# Patient Record
Sex: Female | Born: 1965 | Race: White | Hispanic: No | Marital: Single | Smoking: Never smoker
Health system: Southern US, Community
[De-identification: ages and names within clinical notes are randomized; demographics above are authoritative.]

## PROBLEM LIST (undated history)

## (undated) DIAGNOSIS — F32A Depression, unspecified: Secondary | ICD-10-CM

## (undated) DIAGNOSIS — N289 Disorder of kidney and ureter, unspecified: Secondary | ICD-10-CM

## (undated) DIAGNOSIS — G43909 Migraine, unspecified, not intractable, without status migrainosus: Secondary | ICD-10-CM

## (undated) DIAGNOSIS — F329 Major depressive disorder, single episode, unspecified: Secondary | ICD-10-CM

## (undated) HISTORY — PX: APPENDECTOMY: SHX54

## (undated) HISTORY — PX: INSERT INTRAPERITONEAL CATHETER: SUR716

## (undated) HISTORY — PX: LUMBAR LAMINECTOMY: SHX95

## (undated) HISTORY — PX: ABDOMINAL HYSTERECTOMY: SHX81

## (undated) HISTORY — PX: CHOLECYSTECTOMY: SHX55

---

## 2002-12-25 ENCOUNTER — Encounter: Payer: Self-pay | Admitting: Emergency Medicine

## 2002-12-25 ENCOUNTER — Emergency Department (HOSPITAL_COMMUNITY): Admission: EM | Admit: 2002-12-25 | Discharge: 2002-12-25 | Payer: Self-pay | Admitting: Emergency Medicine

## 2003-01-14 ENCOUNTER — Ambulatory Visit: Admission: RE | Admit: 2003-01-14 | Discharge: 2003-01-14 | Payer: Self-pay | Admitting: Cardiology

## 2003-01-14 ENCOUNTER — Encounter (INDEPENDENT_AMBULATORY_CARE_PROVIDER_SITE_OTHER): Payer: Self-pay | Admitting: Cardiology

## 2003-06-28 ENCOUNTER — Emergency Department (HOSPITAL_COMMUNITY): Admission: EM | Admit: 2003-06-28 | Discharge: 2003-06-28 | Payer: Self-pay | Admitting: Emergency Medicine

## 2003-06-28 ENCOUNTER — Encounter: Payer: Self-pay | Admitting: Emergency Medicine

## 2003-07-01 ENCOUNTER — Encounter: Payer: Self-pay | Admitting: Internal Medicine

## 2003-07-01 ENCOUNTER — Observation Stay (HOSPITAL_COMMUNITY): Admission: AD | Admit: 2003-07-01 | Discharge: 2003-07-02 | Payer: Self-pay | Admitting: Internal Medicine

## 2003-07-02 ENCOUNTER — Encounter (INDEPENDENT_AMBULATORY_CARE_PROVIDER_SITE_OTHER): Payer: Self-pay | Admitting: Specialist

## 2003-08-05 ENCOUNTER — Ambulatory Visit (HOSPITAL_COMMUNITY): Admission: RE | Admit: 2003-08-05 | Discharge: 2003-08-05 | Payer: Self-pay | Admitting: Internal Medicine

## 2006-05-27 ENCOUNTER — Emergency Department (HOSPITAL_COMMUNITY): Admission: EM | Admit: 2006-05-27 | Discharge: 2006-05-27 | Payer: Self-pay | Admitting: Emergency Medicine

## 2007-02-21 IMAGING — CT CT HEAD W/O CM
1 series · 16 of 30 positions shown, 20 images · non-contrast
Comparison: none

CLINICAL DATA: Hit forehead on TV.  Dizziness and headache.
 HEAD CT WITHOUT CONTRAST ? 05/27/06:
TECHNIQUE: Contiguous axial CT images were obtained from the base of the skull through the vertex according to standard protocol without contrast.

[Series 2: head-trauma 4.8 h45s st · axial · 0.45mm/px · z∈[-102,+31]mm · 16 of 30 slices shown, 20 images]
[im 2/30  brain]
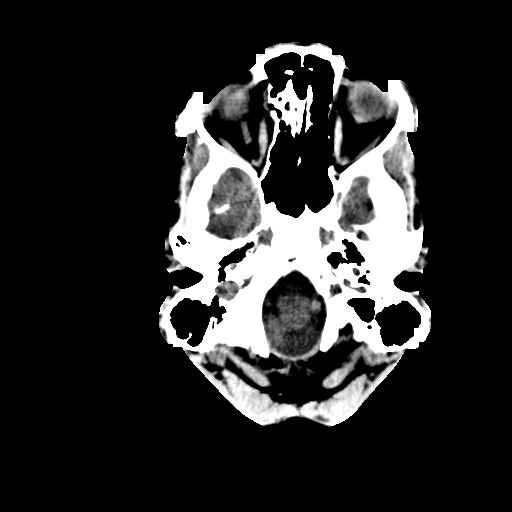
[im 2/30  bone]
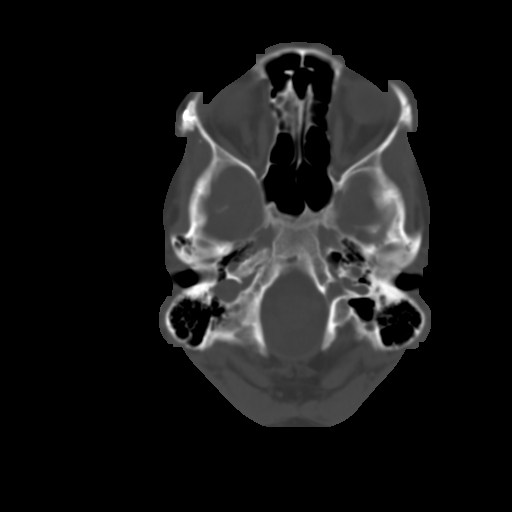
[im 4/30  brain]
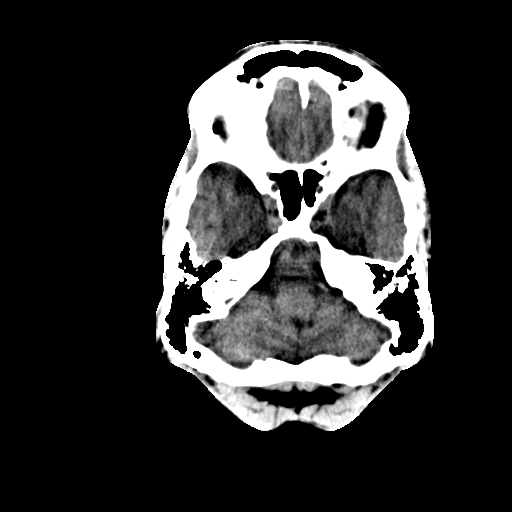
[im 6/30  brain]
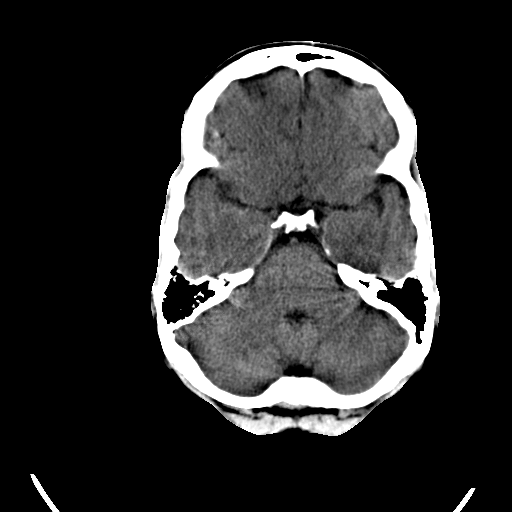
[im 8/30  brain]
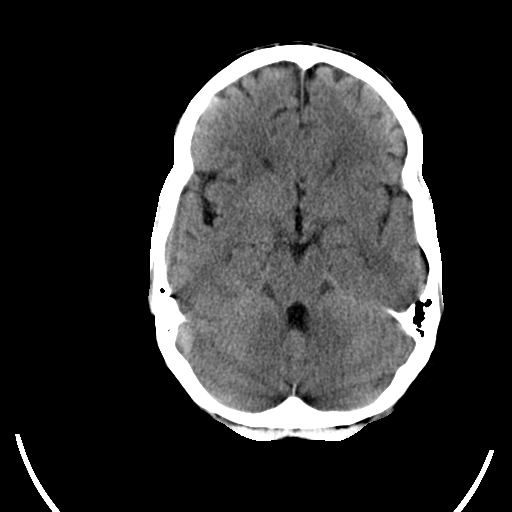
[im 9/30  brain]
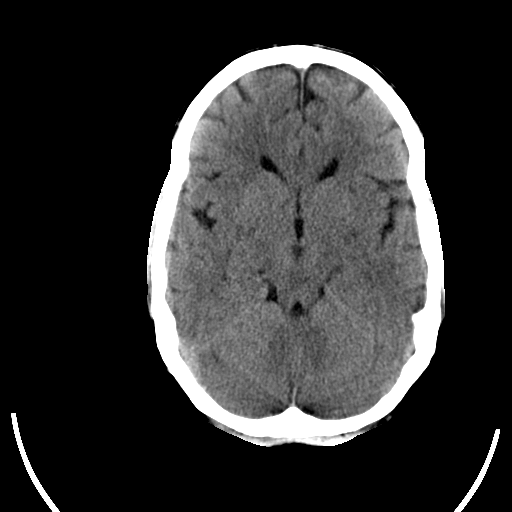
[im 9/30  bone]
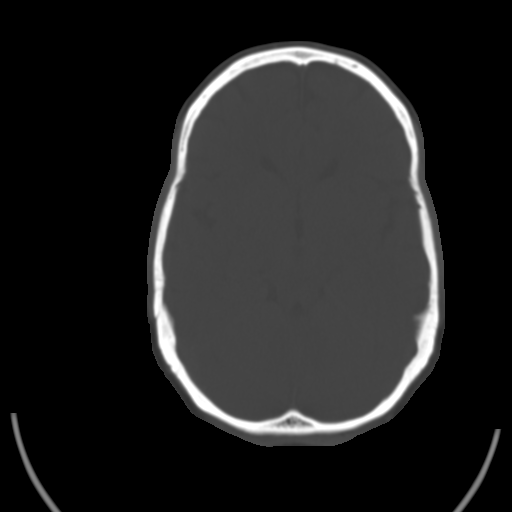
[im 11/30  brain]
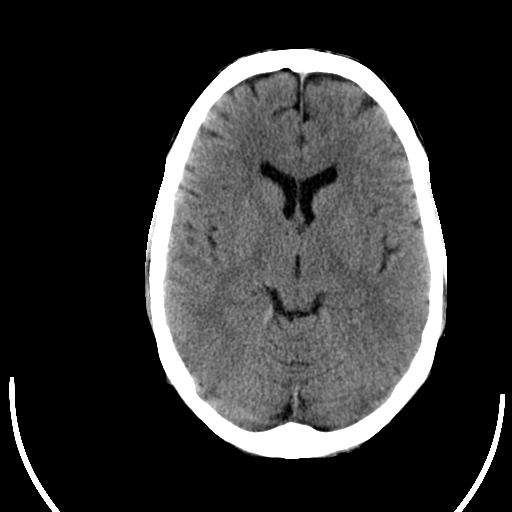
[im 13/30  brain]
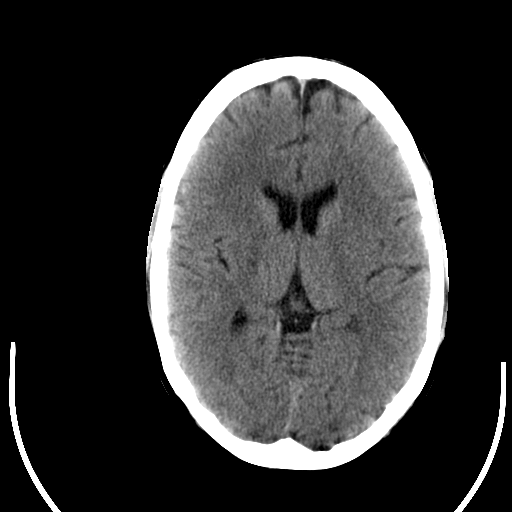
[im 15/30  brain]
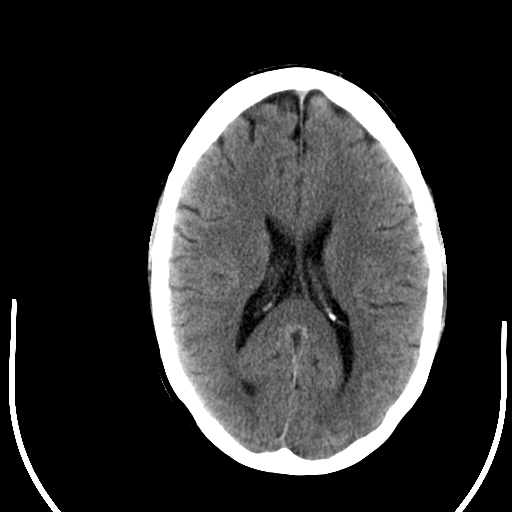
[im 16/30  brain]
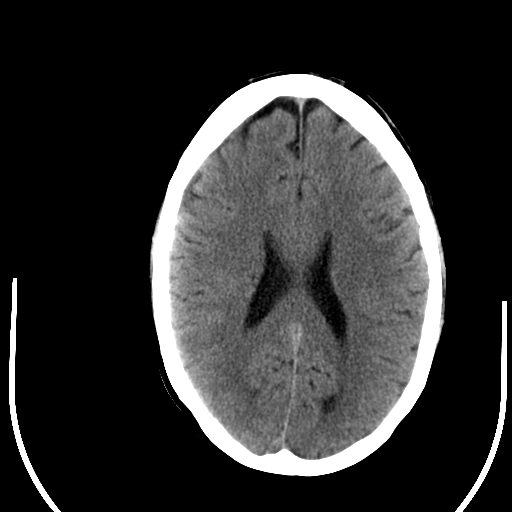
[im 16/30  bone]
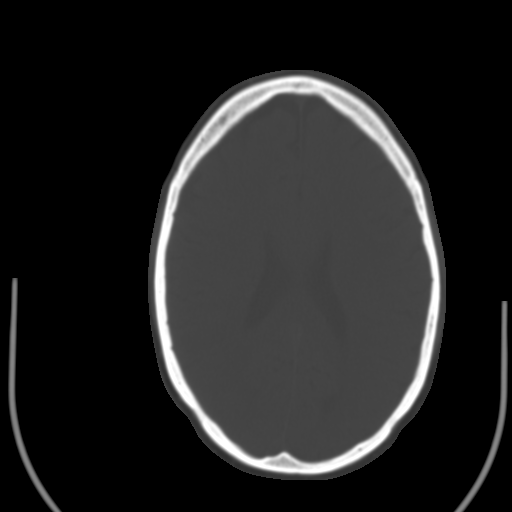
[im 18/30  brain]
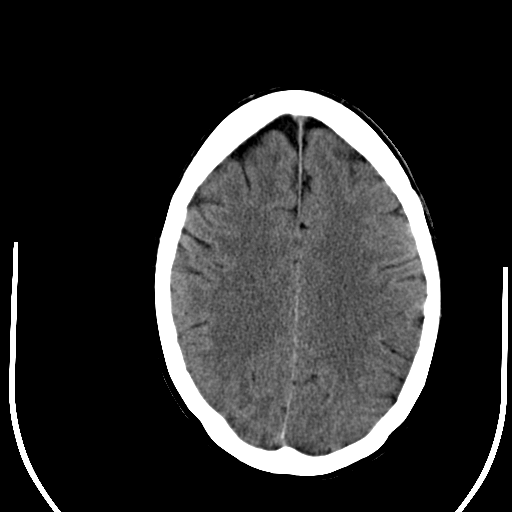
[im 20/30  brain]
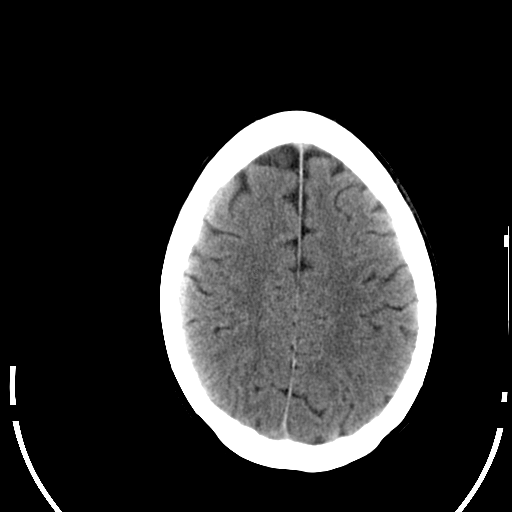
[im 22/30  brain]
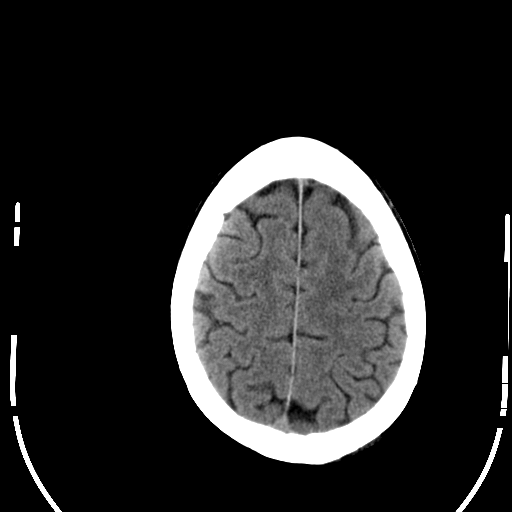
[im 23/30  brain]
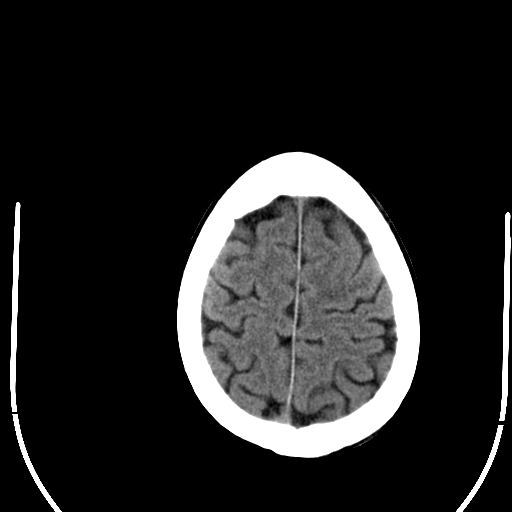
[im 23/30  bone]
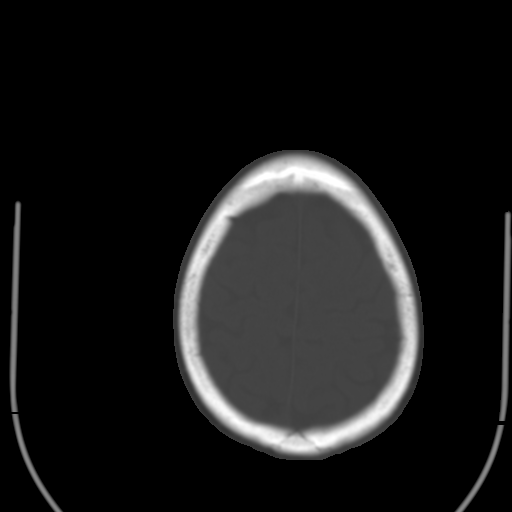
[im 25/30  brain]
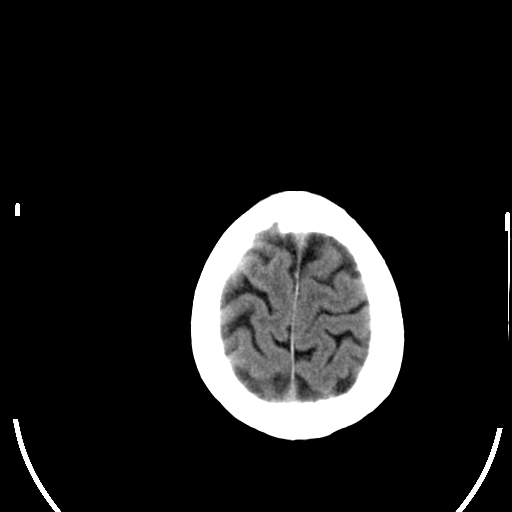
[im 27/30  brain]
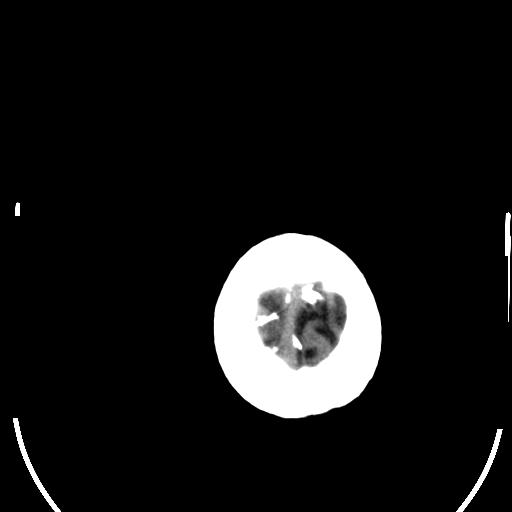
[im 29/30  brain]
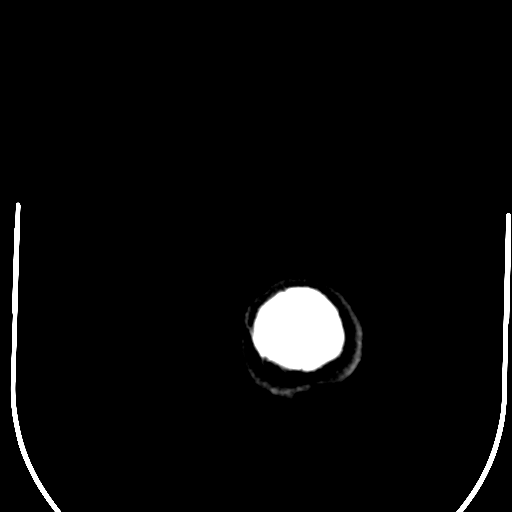

[16 of 30 positions shown; findings below may reference images not displayed]

FINDINGS: No intra-axial or extra-axial hemorrhage.  No mass effect or edema.  Negative for acute infarction.  No acute bony abnormality.  Wire sutures in the right frontozygomatic region.
IMPRESSION: No acute intracranial abnormality.  See comments above.

## 2009-11-12 ENCOUNTER — Emergency Department (HOSPITAL_COMMUNITY): Admission: EM | Admit: 2009-11-12 | Discharge: 2009-11-12 | Payer: Self-pay | Admitting: Emergency Medicine

## 2011-03-16 NOTE — Discharge Summary (Signed)
NAMEBRETT, Jill Simmons                           ACCOUNT NO.:  [id number]   MEDICAL RECORD NO.:  [id number]                   PATIENT TYPE:  INP   LOCATION:  5742                                 FACILITY:  [facility]   PHYSICIAN:  Della Goo, M.D.              DATE OF BIRTH:  02-06-1966   DATE OF ADMISSION:  07/01/2003  DATE OF DISCHARGE:  07/02/2003                                 DISCHARGE SUMMARY   FINAL DIAGNOSES:  1. Severe iron deficiency anemia.  2. Cecal impaction.  3. History of villous adenoma of the appendix.  4. Renal calculi.   HOSPITAL COURSE:  A 45 year old Caucasian female with a history of villous  adenoma, status post surgical and chemotherapy treatments, referred for  direct admission secondary to a hemoglobin level found to be 7.6 on and  after labs were drawn for complaints of severe, continued fatigue.  The  patient was also found to have an MCV of 67.1 and denied having any overt  sign symptoms of GI or GU bleeding.  Further studies were ordered.  An  ESR/sed rate, liver function tests, chemistry, coag studies along with  specialty studies of a CA 125, CEA level.  A CAT scan of the abdomen was  also ordered, and a consultation with gastroenterologist, Lina Sar, M.D.,  was placed.  The patient denies having any symptoms of nausea or vomiting,  weight loss, chest pain, or shortness of breath; however, did have severe  fatigue symptoms and difficulty sleeping.  Her laboratory studies returned  with normal coagulation studies with a PT of 12.3, INR 0.9, PTT 27.  Normal  chemistry levels.  Iron studies were also ordered.  Normal liver function  tests did also return.  Her CA 125 level returned at a level of 16.5 which  is within the normal range along with a CEA level of less than 0.5 which is  within normal limits.  Sed rate/ESR returned at a level of 37.  A KUB study  was ordered, results of which were negative for acute findings; however, did  reveal a  cecal fecal impaction.  The results of the CAT scan of the abdomen  and pelvis revealed findings of calcified granulomata in the liver and  spleen, renal calculi in the upper and lower pole of the right kidney, no  evidence of recurrent or metastatic disease in the abdomen, a small  umbilical hernia containing a loop of uncomplicated small bowel.  The scan  of the pelvis revealed a prominent uterus, possibly secondary to uterine  fibroids, no adnexal masses, no free fluid.  Small phleboliths were present  in the pelvis.   The patient underwent evaluation by gastroenterologist, Lina Sar, M.D.  An upper endoscopy was performed with normal findings of the esophagus,  stomach, and duodenum.  Small bowel biopsies were performed.  Arrangements  were also made for further evaluation as an outpatient where  a colonoscopy  would be performed.  The patient was administered IV iron therapy.  The  patient had no acute events while hospitalized and after transfusion of two  units of packed red blood cells, her hemoglobin level did improve  appropriately after transfusion of two units to a level of 10.3.  The  patient was discharged to home on July 02, 2003.   DISCHARGE MEDICATIONS:  1. Resuming her previous medications of Lexapro 10 mg 1 tab p.o. daily.  2. OxyContin 40 mg 1/2 tab q.12h. p.r.n.  3. Peri-Colace 1 tab p.o. b.i.d.  4. Senna 2 tabs p.o. daily.  5. Reglan 10 mg 1 tab b.i.d.  6. Lactulose 60 mL 1 daily p.r.n. constipation.  7. Xanax 0.5 mg 1 tab b.i.d. p.r.n.  8. Diflucan 200 mg 1 tab p.o. daily x 3 days.   The patient was to follow up with the office in 5-7 days.  The patient was  also to follow up with Lina Sar, M.D. on Monday, September 20, at 1 p.m.  and on September 27, at 2:30 p.m. for her colonoscopy.  On discharge,  patient was found to be hemodynamically stable and at her baseline with a  stable hemoglobin level of 10.3.                                                 Della Goo, M.D.    HJ/MEDQ  D:  08/29/2003  T:  08/29/2003  Job:  [id number]

## 2011-03-16 NOTE — Op Note (Signed)
NAME:  Jill Simmons, Jill Simmons                           ACCOUNT NO.:  [id number]   MEDICAL RECORD NO.:  [id number]                   PATIENT TYPE:  INP   LOCATION:  5742                                 FACILITY:  [facility]   PHYSICIAN:  Lina Sar, M.D. [facility]               DATE OF BIRTH:  1965-11-17   DATE OF PROCEDURE:  DATE OF DISCHARGE:  07/02/2003                                 OPERATIVE REPORT   PROCEDURE:  Upper endoscopy.   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  Her stool Hemoccult status could  not be determined because there was no specimen to be tested.  She was not  severely anemic last fall when she was evaluated for menorrhagia.  She has a  history of colon resection for adenomatous polyp of the appendix with  chemotherapy for pseudomyxoma of the peritoneum.  Since then, she has had  small-bowel obstructions which have been handled conservatively.  The  patient has had intermittent nausea, she has iron intolerance which has been  limiting her iron intake and she has not taken any iron supplements other  than in multivitamins.  Her periods have been normal.  She is undergoing  upper endoscopy as well as possible colonoscopy to further evaluate her for  GI blood loss.   ENDOSCOPE:  Fujinon single-chamber scope.   SEDATION:  Versed 5 mg IV, fentanyl 60 mcg IV.   FINDINGS:  Fujinon single-chamber scope passed into throat, posterior  pharynx and esophagus.  The patient was monitored by pulse oximetry and her  oxygen saturations were normal.  The proximal and distal esophageal mucosa  was unremarkable.  There was a normal squamocolumnar junction.  The stomach  was insufflated with air and showed normal gastric folds, antrum and pyloric  outlet.  Retroflexion of the endoscope revealed a normal fundus and cardia.   Duodenum:  The duodenal bulb and descending duodenum were normal.  Multiple  biopsies were taken  from the descending duodenum and sent to pathology for  H&E stains to rule out villous atrophy.  The endoscope was then retracted.   The patient tolerated the procedure well.   IMPRESSION:  Normal upper endoscopy of esophagus, stomach, and duodenum,  status post small-bowel biopsies.   PLAN:  1. The patient will be scheduled for outpatient colonoscopy as soon as it     has been arranged.  2. She will receive iron infusion today and will be encouraged to take oral     iron.                                               Lina Sar, M.D. [facility]   DB/MEDQ  D:  07/02/2003  T:  07/03/2003  Job:  [id number]   cc:   Della Goo, M.D.

## 2012-10-28 ENCOUNTER — Encounter (HOSPITAL_COMMUNITY): Payer: Self-pay | Admitting: *Deleted

## 2012-10-28 ENCOUNTER — Emergency Department (HOSPITAL_COMMUNITY)
Admission: EM | Admit: 2012-10-28 | Discharge: 2012-10-29 | Disposition: A | Discharge status: Home / Self Care | Payer: Self-pay | Attending: Emergency Medicine | Admitting: Emergency Medicine

## 2012-10-28 DIAGNOSIS — R51 Headache: Secondary | ICD-10-CM

## 2012-10-28 DIAGNOSIS — Z87442 Personal history of urinary calculi: Secondary | ICD-10-CM | POA: Insufficient documentation

## 2012-10-28 DIAGNOSIS — G43909 Migraine, unspecified, not intractable, without status migrainosus: Secondary | ICD-10-CM | POA: Insufficient documentation

## 2012-10-28 HISTORY — DX: Migraine, unspecified, not intractable, without status migrainosus: G43.909

## 2012-10-28 HISTORY — DX: Depression, unspecified: F32.A

## 2012-10-28 HISTORY — DX: Disorder of kidney and ureter, unspecified: N28.9

## 2012-10-28 HISTORY — DX: Major depressive disorder, single episode, unspecified: F32.9

## 2012-10-28 LAB — URINALYSIS, ROUTINE W REFLEX MICROSCOPIC
Glucose, UA: NEGATIVE mg/dL
Hgb urine dipstick: NEGATIVE
Specific Gravity, Urine: >1.03 — ABNORMAL HIGH (ref 1.005–1.030)

## 2012-10-28 MED ORDER — DEXAMETHASONE SODIUM PHOSPHATE 10 MG/ML IJ SOLN
10.0000 mg | Freq: Once | INTRAMUSCULAR | Status: AC
  Administered 2012-10-28: 10 mg via INTRAVENOUS
  Filled 2012-10-28: qty 1

## 2012-10-28 MED ORDER — KETOROLAC TROMETHAMINE 30 MG/ML IJ SOLN: 30.0000 mg | Freq: Once | INTRAMUSCULAR | Status: DC

## 2012-10-28 MED ORDER — DIPHENHYDRAMINE HCL 50 MG/ML IJ SOLN
25.0000 mg | Freq: Once | INTRAMUSCULAR | Status: AC
  Administered 2012-10-28: 25 mg via INTRAVENOUS
  Filled 2012-10-28: qty 1

## 2012-10-28 MED ORDER — HYDROMORPHONE HCL PF 1 MG/ML IJ SOLN
1.0000 mg | Freq: Once | INTRAMUSCULAR | Status: AC
  Administered 2012-10-28: 1 mg via INTRAMUSCULAR
  Filled 2012-10-28: qty 1

## 2012-10-28 MED ORDER — METOCLOPRAMIDE HCL 5 MG/ML IJ SOLN
20.0000 mg | Freq: Once | INTRAVENOUS | Status: AC
  Administered 2012-10-28: 20 mg via INTRAVENOUS
  Filled 2012-10-28 (×2): qty 4

## 2012-10-28 NOTE — ED Notes (Signed)
Pt reports migraine HA that started around 0730 this morning. Pt has taken her home meds w/ no relief.

## 2012-10-28 NOTE — ED Provider Notes (Addendum)
History  This chart was scribed for Gerhard Munch, MD by Bennett Scrape, ED Scribe. This patient was seen in room APA12/APA12 and the patient's care was started at 9:39 PM.  CSN: [id number]  Arrival date & time 10/28/12  2122   First MD Initiated Contact with Patient 10/28/12 2139      Chief Complaint  Patient presents with  . Migraine     The history is provided by the patient. No language interpreter was used.    Jill Simmons is a 46 y.o. female who presents to the Emergency Department complaining of approximately 12 hours of gradual onset, gradually worsening, constant HA described as throbbing with associated nausea and photophobia. She states that she was seen by her PCP today and given medications IM with no improvement. She reports that she has a h/o HAs and states that this HA is similar to prior HAs experienced in the past but that this episode is longer lasting and not responding to medications. She reports that she has tried benadryl, Imitrex, Reglan and other medications without improvement.  She states that she usually receives stadol and phenergan in the ED with improvement. She reports that she has been under a lot of stress due to a recent death which could have triggered this HA. She denies visual disturbance, emesis, diarrhea and CP as associated symptoms. She does not have a h/o chronic medical conditions and she denies smoking and alcohol use.  Past Medical History  Diagnosis Date  . Migraine   . Renal disorder (kidney stones)     Past Surgical History  Procedure Date  . Abdominal hysterectomy   . Cholecystectomy   . Appendectomy   . Lumbar laminectomy     No family history on file.  History  Substance Use Topics  . Smoking status: Never Smoker   . Smokeless tobacco: Not on file  . Alcohol Use: No    No OB history provided.  Review of Systems  Constitutional:       Per HPI, otherwise negative  HENT:       Per HPI, otherwise negative  Eyes:  Positive for photophobia. Negative for visual disturbance.  Respiratory:       Per HPI, otherwise negative  Cardiovascular:       Per HPI, otherwise negative  Gastrointestinal: Positive for nausea. Negative for vomiting and diarrhea.  Genitourinary: Negative.   Musculoskeletal:       Per HPI, otherwise negative  Skin: Negative.   Neurological: Positive for headaches. Negative for syncope.    Allergies  Sulfa antibiotics and Toradol  Home Medications  No current outpatient prescriptions on file.  There were no vitals taken for this visit.  Physical Exam  Nursing note and vitals reviewed. Constitutional: She is oriented to person, place, and time. She appears well-developed and well-nourished. No distress.  HENT:  Head: Normocephalic and atraumatic.  Eyes: Conjunctivae normal and EOM are normal.  Cardiovascular: Normal rate and regular rhythm.   Pulmonary/Chest: Effort normal and breath sounds normal. No stridor. No respiratory distress.  Abdominal: She exhibits no distension.  Musculoskeletal: She exhibits no edema.  Neurological: She is alert and oriented to person, place, and time. No cranial nerve deficit.  Skin: Skin is warm and dry.  Psychiatric: She has a normal mood and affect.    ED Course  Procedures (including critical care time)  DIAGNOSTIC STUDIES: None performed.   COORDINATION OF CARE: 9:50 PM- Discussed treatment plan which includes medications with pt at bedside and  pt agreed to plan.  10:00 PM- Ordered 25 mg Benadryl injection, 10 mg Decadron injection and 20 mg Reglan IVPB   11:34 PM Patient is marginally better.  Additional analgesics offered.  Labs Reviewed  URINALYSIS, ROUTINE W REFLEX MICROSCOPIC   No results found.   No diagnosis found.   Date: 10/29/2012  Rate: 79  Rhythm: normal sinus rhythm  QRS Axis: normal  Intervals: normal  ST/T Wave abnormalities: normal  Conduction Disutrbances: none  Narrative Interpretation:  unremarkable      MDM   I personally performed the services described in this documentation, which was scribed in my presence. The recorded information has been reviewed and is accurate.   This female with history of migraines presents with a characteristically typical headache.  On exam the patient has no neurologic deficits, is in no distress, though she appears uncomfortable.  The patient's vital signs are stable.  With her history, the absence of new complaints or findings, there is low suspicion for acute new pathology.  The patient had improvement with analgesics, fluids.  She was discharged in stable condition with primary care physician followup    Gerhard Munch, MD 10/28/12 9604  Gerhard Munch, MD 10/29/12 0005

## 2012-11-02 ENCOUNTER — Emergency Department (HOSPITAL_COMMUNITY)
Admission: EM | Admit: 2012-11-02 | Discharge: 2012-11-02 | Disposition: A | Discharge status: Home / Self Care | Payer: Self-pay | Attending: Emergency Medicine | Admitting: Emergency Medicine

## 2012-11-02 ENCOUNTER — Encounter (HOSPITAL_COMMUNITY): Payer: Self-pay | Admitting: *Deleted

## 2012-11-02 DIAGNOSIS — Z79899 Other long term (current) drug therapy: Secondary | ICD-10-CM | POA: Insufficient documentation

## 2012-11-02 DIAGNOSIS — Z87448 Personal history of other diseases of urinary system: Secondary | ICD-10-CM | POA: Insufficient documentation

## 2012-11-02 DIAGNOSIS — IMO0002 Reserved for concepts with insufficient information to code with codable children: Secondary | ICD-10-CM | POA: Insufficient documentation

## 2012-11-02 DIAGNOSIS — M436 Torticollis: Secondary | ICD-10-CM | POA: Insufficient documentation

## 2012-11-02 DIAGNOSIS — G43909 Migraine, unspecified, not intractable, without status migrainosus: Secondary | ICD-10-CM | POA: Insufficient documentation

## 2012-11-02 DIAGNOSIS — F329 Major depressive disorder, single episode, unspecified: Secondary | ICD-10-CM | POA: Insufficient documentation

## 2012-11-02 DIAGNOSIS — F3289 Other specified depressive episodes: Secondary | ICD-10-CM | POA: Insufficient documentation

## 2012-11-02 MED ORDER — DEXAMETHASONE SODIUM PHOSPHATE 10 MG/ML IJ SOLN
10.0000 mg | Freq: Once | INTRAMUSCULAR | Status: AC
  Administered 2012-11-02: 10 mg via INTRAMUSCULAR
  Filled 2012-11-02: qty 1

## 2012-11-02 MED ORDER — HYDROXYZINE HCL 50 MG/ML IM SOLN: 25.0000 mg | Freq: Once | INTRAMUSCULAR | Status: DC

## 2012-11-02 MED ORDER — METOCLOPRAMIDE HCL 5 MG/ML IJ SOLN
10.0000 mg | Freq: Once | INTRAMUSCULAR | Status: AC
  Administered 2012-11-02: 10 mg via INTRAMUSCULAR
  Filled 2012-11-02: qty 2

## 2012-11-02 MED ORDER — DIPHENHYDRAMINE HCL 50 MG/ML IJ SOLN
25.0000 mg | Freq: Once | INTRAMUSCULAR | Status: AC
Start: 2012-11-02 — End: 2012-11-02
  Administered 2012-11-02: 25 mg via INTRAMUSCULAR
  Filled 2012-11-02: qty 1

## 2012-11-02 NOTE — ED Notes (Signed)
Pt c/o migraine, n/v, and tension in neck since 11:30 this morning.

## 2012-11-02 NOTE — ED Notes (Addendum)
Migraine since 11am. States history of migraine, states she has tried all of her normal meds and nothing has worked. Also notes n/v. States she has appt at primary care physician tomorrow. No other complaints at this time.

## 2012-11-02 NOTE — ED Provider Notes (Signed)
History     CSN: [id number]  Arrival date & time 11/02/12  2001   First MD Initiated Contact with Patient 11/02/12 2049      Chief Complaint  Patient presents with  . Emesis  . Nausea  . Torticollis    (Consider location/radiation/quality/duration/timing/severity/associated sxs/prior treatment) HPI Comments: Patient comes to the ER for evaluation of a migraine headache. Patient reports that pain started around 11:30 this morning. She has had slow progression of her migraine symptoms over the course of the day. Patient reports a sharp and throbbing pain in the frontal area of her head. She has light sensitivity. Patient also endorses nausea and vomiting. She took her Imitrex, prednisone and Topamax without relief. She does, however, report that there is nothing unusual about this headache. She has frequent chronic migraines and this is exactly identical to previous headaches.  Patient is a 47 y.o. female presenting with vomiting.  Emesis  Associated symptoms include headaches.    Past Medical History  Diagnosis Date  . Migraine   . Renal disorder   . Depression     Past Surgical History  Procedure Date  . Abdominal hysterectomy   . Cholecystectomy   . Appendectomy   . Lumbar laminectomy     History reviewed. No pertinent family history.  History  Substance Use Topics  . Smoking status: Never Smoker   . Smokeless tobacco: Not on file  . Alcohol Use: No    OB History    Grav Para Term Preterm Abortions TAB SAB Ect Mult Living                  Review of Systems  Eyes: Positive for photophobia.  Gastrointestinal: Positive for nausea and vomiting.  Neurological: Positive for headaches.  All other systems reviewed and are negative.    Allergies  Bee venom; Other; Sulfa antibiotics; and Toradol  Home Medications   Current Outpatient Rx  Name  Route  Sig  Dispense  Refill  . BUTALBITAL-APAP-CAFFEINE 50-325-40 MG PO TABS   Oral   Take 2 tablets by mouth  every 4 (four) hours as needed. Headaches         . CITALOPRAM HYDROBROMIDE 40 MG PO TABS   Oral   Take 40 mg by mouth daily.         Marland Kitchen PREDNISONE 20 MG PO TABS   Oral   Take 60 mg by mouth daily.         Marland Kitchen PROMETHAZINE HCL 25 MG PO TABS   Oral   Take 25 mg by mouth every 6 (six) hours as needed. nausea         . SUMATRIPTAN SUCCINATE 100 MG PO TABS   Oral   Take 100 mg by mouth every 2 (two) hours as needed. migraines         . TOPIRAMATE 100 MG PO TABS   Oral   Take 150 mg by mouth 2 (two) times daily.           BP 143/94  Pulse 95  Temp 98.1 F (36.7 C) (Oral)  Resp 20  Ht 5\' 4"  (1.626 m)  Wt 165 lb (74.844 kg)  BMI 28.32 kg/m2  SpO2 99%  Physical Exam  Constitutional: She is oriented to person, place, and time. She appears well-developed and well-nourished. No distress.  HENT:  Head: Normocephalic and atraumatic.  Right Ear: Hearing normal.  Nose: Nose normal.  Mouth/Throat: Oropharynx is clear and moist and mucous membranes are normal.  Eyes: Conjunctivae normal and EOM are normal. Pupils are equal, round, and reactive to light.  Neck: Normal range of motion. Neck supple. No Brudzinski's sign and no Kernig's sign noted.  Cardiovascular: Normal rate, regular rhythm, S1 normal and S2 normal.  Exam reveals no gallop and no friction rub.   No murmur heard. Pulmonary/Chest: Effort normal and breath sounds normal. No respiratory distress. She exhibits no tenderness.  Abdominal: Soft. Normal appearance and bowel sounds are normal. There is no hepatosplenomegaly. There is no tenderness. There is no rebound, no guarding, no tenderness at McBurney's point and negative Murphy's sign. No hernia.  Musculoskeletal: Normal range of motion.  Neurological: She is alert and oriented to person, place, and time. She has normal strength. No cranial nerve deficit or sensory deficit. Coordination normal. GCS eye subscore is 4. GCS verbal subscore is 5. GCS motor subscore is  6.  Skin: Skin is warm, dry and intact. No rash noted. No cyanosis.  Psychiatric: She has a normal mood and affect. Her speech is normal and behavior is normal. Thought content normal.    ED Course  Procedures (including critical care time)  Labs Reviewed - No data to display No results found.   1. Migraine       MDM  Patient presents to the ER with migraine symptoms. Patient has had frequent migraines previously. She reports that the headache symptoms she is currently experiencing are identical to previous migraines without any unusual features. Headache was slow in onset and progression, not a thunderclap headache. Based on these findings, workup is not necessary.      Gilda Crease, MD 11/02/12 2113

## 2012-12-06 ENCOUNTER — Emergency Department (HOSPITAL_COMMUNITY): Payer: Self-pay

## 2012-12-06 ENCOUNTER — Emergency Department (HOSPITAL_COMMUNITY)
Admission: EM | Admit: 2012-12-06 | Discharge: 2012-12-06 | Disposition: A | Discharge status: Home / Self Care | Payer: Self-pay | Attending: Emergency Medicine | Admitting: Emergency Medicine

## 2012-12-06 ENCOUNTER — Encounter (HOSPITAL_COMMUNITY): Payer: Self-pay | Admitting: Emergency Medicine

## 2012-12-06 DIAGNOSIS — Z90710 Acquired absence of both cervix and uterus: Secondary | ICD-10-CM | POA: Insufficient documentation

## 2012-12-06 DIAGNOSIS — IMO0002 Reserved for concepts with insufficient information to code with codable children: Secondary | ICD-10-CM | POA: Insufficient documentation

## 2012-12-06 DIAGNOSIS — Z9089 Acquired absence of other organs: Secondary | ICD-10-CM | POA: Insufficient documentation

## 2012-12-06 DIAGNOSIS — R197 Diarrhea, unspecified: Secondary | ICD-10-CM | POA: Insufficient documentation

## 2012-12-06 DIAGNOSIS — Z79899 Other long term (current) drug therapy: Secondary | ICD-10-CM | POA: Insufficient documentation

## 2012-12-06 DIAGNOSIS — Z8742 Personal history of other diseases of the female genital tract: Secondary | ICD-10-CM | POA: Insufficient documentation

## 2012-12-06 DIAGNOSIS — F3289 Other specified depressive episodes: Secondary | ICD-10-CM | POA: Insufficient documentation

## 2012-12-06 DIAGNOSIS — F329 Major depressive disorder, single episode, unspecified: Secondary | ICD-10-CM | POA: Insufficient documentation

## 2012-12-06 DIAGNOSIS — E876 Hypokalemia: Secondary | ICD-10-CM | POA: Diagnosis present

## 2012-12-06 DIAGNOSIS — G43909 Migraine, unspecified, not intractable, without status migrainosus: Secondary | ICD-10-CM | POA: Insufficient documentation

## 2012-12-06 DIAGNOSIS — R109 Unspecified abdominal pain: Secondary | ICD-10-CM | POA: Insufficient documentation

## 2012-12-06 DIAGNOSIS — R112 Nausea with vomiting, unspecified: Secondary | ICD-10-CM | POA: Insufficient documentation

## 2012-12-06 LAB — URINALYSIS, MICROSCOPIC ONLY
Bilirubin Urine: NEGATIVE
Glucose, UA: NEGATIVE mg/dL
Ketones, ur: NEGATIVE mg/dL
Leukocytes, UA: NEGATIVE
pH: 6.5 (ref 5.0–8.0)

## 2012-12-06 LAB — COMPREHENSIVE METABOLIC PANEL
Albumin: 3.4 g/dL — ABNORMAL LOW (ref 3.5–5.2)
Alkaline Phosphatase: 78 U/L (ref 39–117)
BUN: 7 mg/dL (ref 6–23)
Chloride: 104 mEq/L (ref 96–112)
Potassium: 3.2 mEq/L — ABNORMAL LOW (ref 3.5–5.1)
Total Bilirubin: 0.3 mg/dL (ref 0.3–1.2)

## 2012-12-06 LAB — LIPASE, BLOOD: Lipase: 16 U/L (ref 11–59)

## 2012-12-06 LAB — CBC WITH DIFFERENTIAL/PLATELET
Eosinophils Relative: 2 % (ref 0–5)
HCT: 33.1 % — ABNORMAL LOW (ref 36.0–46.0)
Lymphocytes Relative: 38 % (ref 12–46)
Lymphs Abs: 2.4 10*3/uL (ref 0.7–4.0)
MCV: 90.7 fL (ref 78.0–100.0)
Monocytes Absolute: 0.6 10*3/uL (ref 0.1–1.0)
Monocytes Relative: 10 % (ref 3–12)
RBC: 3.65 MIL/uL — ABNORMAL LOW (ref 3.87–5.11)
WBC: 6.3 10*3/uL (ref 4.0–10.5)

## 2012-12-06 MED ORDER — POTASSIUM CHLORIDE CRYS ER 20 MEQ PO TBCR
40.0000 meq | EXTENDED_RELEASE_TABLET | Freq: Once | ORAL | Status: AC
  Administered 2012-12-06: 40 meq via ORAL
  Filled 2012-12-06: qty 2

## 2012-12-06 MED ORDER — ONDANSETRON HCL 4 MG/2ML IJ SOLN: 4.0000 mg | Freq: Once | INTRAMUSCULAR | Status: DC

## 2012-12-06 MED ORDER — PROMETHAZINE HCL 25 MG/ML IJ SOLN
25.0000 mg | Freq: Once | INTRAMUSCULAR | Status: AC
  Administered 2012-12-06: 25 mg via INTRAVENOUS
  Filled 2012-12-06: qty 1

## 2012-12-06 MED ORDER — MORPHINE SULFATE 4 MG/ML IJ SOLN
4.0000 mg | Freq: Once | INTRAMUSCULAR | Status: AC
  Administered 2012-12-06: 4 mg via INTRAVENOUS
  Filled 2012-12-06: qty 1

## 2012-12-06 MED ORDER — BUTORPHANOL TARTRATE 1 MG/ML IJ SOLN: 1.0000 mg | Freq: Once | INTRAMUSCULAR | Status: AC

## 2012-12-06 MED ORDER — DIPHENHYDRAMINE HCL 50 MG/ML IJ SOLN
25.0000 mg | Freq: Once | INTRAMUSCULAR | Status: AC
  Administered 2012-12-06: 25 mg via INTRAVENOUS
  Filled 2012-12-06: qty 1

## 2012-12-06 MED ORDER — METOCLOPRAMIDE HCL 5 MG/ML IJ SOLN
10.0000 mg | Freq: Once | INTRAMUSCULAR | Status: AC
  Administered 2012-12-06: 10 mg via INTRAVENOUS
  Filled 2012-12-06: qty 2

## 2012-12-06 MED ORDER — BUTORPHANOL TARTRATE 2 MG/ML IJ SOLN
INTRAMUSCULAR | Status: AC
  Administered 2012-12-06: 1 mg
  Filled 2012-12-06: qty 1

## 2012-12-06 MED ORDER — SODIUM CHLORIDE 0.9 % IV BOLUS (SEPSIS)
1000.0000 mL | Freq: Once | INTRAVENOUS | Status: AC
  Administered 2012-12-06: 1000 mL via INTRAVENOUS

## 2012-12-06 MED ORDER — BUTORPHANOL TARTRATE 1 MG/ML IJ SOLN: 1.0000 mg | Freq: Once | INTRAMUSCULAR | Status: DC

## 2012-12-06 NOTE — ED Notes (Signed)
md at bedside

## 2012-12-06 NOTE — ED Provider Notes (Signed)
I have supervised the resident on the management of this patient and agree with the note above. I personally interviewed and examined the patient and my addendum is below.   Jill Simmons is a 47 y.o. female hx of migraines, multiple abdominal surgeries here with HA, ab pain. HA is similar to her previous migraines. Nonfocal neuro exam. HA improved with fluids, reglan, benadryl, morphine, and stadol. She had hx of SBO in the past but the xray didn't show SBO today. Abdominal pain improved after meds. Labs unremarkable other than hypokalemia. D/c home in stable condition.    Richardean Canal, MD 12/07/12 0000

## 2012-12-06 NOTE — ED Notes (Signed)
Dr. Romeo Apple made aware of pt's pain and nausea.

## 2012-12-06 NOTE — ED Notes (Signed)
md at bedside  Pt alert and oriented x4. Respirations even and unlabored, bilateral symmetrical rise and fall of chest. Skin warm and dry. In no acute distress. Denies needs.   

## 2012-12-06 NOTE — ED Notes (Addendum)
Pt reports 8/10 migraine headache that started 0600 am. Pt also reports 8/10 upper left abdominal pain. Pt reports N/V/D. Pt has taken Imitrex, Fioricet, benadryl, and prednisone, and motrin for pain; all of which has not relieved the pain. Pt has also taken 25 mg of phenergan. Bowel sounds audible in all quadrants, pt reports tenderness upon palpation, last bowl moment was today.

## 2012-12-06 NOTE — ED Provider Notes (Signed)
History     CSN: [id number]  Arrival date & time 12/06/12  1541   First MD Initiated Contact with Patient 12/06/12 1649      Chief Complaint  Patient presents with  . Migraine  . Abdominal Pain    (Consider location/radiation/quality/duration/timing/severity/associated sxs/prior treatment) Patient is a 47 y.o. female presenting with migraines and abdominal pain. The history is provided by the patient.  Migraine This is a chronic problem. The current episode started today. The problem occurs constantly. The problem has been unchanged. Associated symptoms include abdominal pain, headaches, nausea and vomiting. Pertinent negatives include no chest pain, congestion, coughing, fatigue, fever or neck pain. Nothing aggravates the symptoms. Treatments tried: prednisone, fioricet, benadryl, imitrex. The treatment provided no relief.  Abdominal Pain Pain location:  LUQ and epigastric Pain quality: aching   Pain radiates to:  Does not radiate Pain severity:  Mild Onset quality:  Gradual Timing:  Constant Progression:  Unchanged Chronicity:  Recurrent Associated symptoms: diarrhea, nausea and vomiting   Associated symptoms: no chest pain, no cough, no dysuria, no fatigue, no fever, no hematuria and no shortness of breath     Past Medical History  Diagnosis Date  . Migraine   . Renal disorder   . Depression     Past Surgical History  Procedure Laterality Date  . Abdominal hysterectomy    . Cholecystectomy    . Appendectomy    . Lumbar laminectomy      No family history on file.  History  Substance Use Topics  . Smoking status: Never Smoker   . Smokeless tobacco: Not on file  . Alcohol Use: No    OB History   Grav Para Term Preterm Abortions TAB SAB Ect Mult Living                  Review of Systems  Constitutional: Negative for fever and fatigue.  HENT: Negative for congestion, drooling and neck pain.   Eyes: Negative for pain.  Respiratory: Negative for cough and  shortness of breath.   Cardiovascular: Negative for chest pain.  Gastrointestinal: Positive for nausea, vomiting, abdominal pain and diarrhea.  Genitourinary: Negative for dysuria and hematuria.  Musculoskeletal: Negative for back pain and gait problem.  Skin: Negative for color change.  Neurological: Positive for headaches. Negative for dizziness.  Hematological: Negative for adenopathy.  Psychiatric/Behavioral: Negative for behavioral problems.  All other systems reviewed and are negative.    Allergies  Bee venom; Other; Sulfa antibiotics; and Toradol  Home Medications   Current Outpatient Rx  Name  Route  Sig  Dispense  Refill  . butalbital-acetaminophen-caffeine (FIORICET, ESGIC) 50-325-40 MG per tablet   Oral   Take 2 tablets by mouth every 4 (four) hours as needed. Headaches         . clonazePAM (KLONOPIN) 1 MG tablet   Oral   Take 1 mg by mouth 2 (two) times daily.         . diphenhydrAMINE (BENADRYL) 50 MG capsule   Oral   Take 50 mg by mouth as needed (headache).         Marland Kitchen ibuprofen (ADVIL,MOTRIN) 200 MG tablet   Oral   Take 800 mg by mouth every 6 (six) hours as needed for pain.         . predniSONE (DELTASONE) 20 MG tablet   Oral   Take 60 mg by mouth as needed (headache).          . promethazine (PHENERGAN) 25 MG  tablet   Oral   Take 25 mg by mouth every 6 (six) hours as needed. nausea         . SUMAtriptan (IMITREX) 100 MG tablet   Oral   Take 100 mg by mouth every 2 (two) hours as needed. migraines         . topiramate (TOPAMAX) 100 MG tablet   Oral   Take 150 mg by mouth 2 (two) times daily.           BP 122/89  Pulse 92  Temp(Src) 98.2 F (36.8 C) (Oral)  Resp 16  SpO2 96%  Physical Exam  Nursing note and vitals reviewed. Constitutional: She is oriented to person, place, and time. She appears well-developed and well-nourished.  HENT:  Head: Normocephalic.  Mouth/Throat: No oropharyngeal exudate.  Eyes: Conjunctivae and  EOM are normal. Pupils are equal, round, and reactive to light.  Neck: Normal range of motion. Neck supple.  Cardiovascular: Normal rate, regular rhythm, normal heart sounds and intact distal pulses.  Exam reveals no gallop and no friction rub.   No murmur heard. Pulmonary/Chest: Effort normal and breath sounds normal. No respiratory distress. She has no wheezes.  Abdominal: Soft. Bowel sounds are normal. There is tenderness (mild ttp of epig and LUQ). There is no rebound and no guarding.  Musculoskeletal: Normal range of motion. She exhibits no edema and no tenderness.  Neurological: She is alert and oriented to person, place, and time. She has normal strength. No cranial nerve deficit or sensory deficit. She displays a negative Romberg sign.  Skin: Skin is warm and dry.  Psychiatric: She has a normal mood and affect. Her behavior is normal.    ED Course  Procedures (including critical care time)  Labs Reviewed  CBC WITH DIFFERENTIAL - Abnormal; Notable for the following:    RBC 3.65 (*)    Hemoglobin 11.4 (*)    HCT 33.1 (*)    All other components within normal limits  COMPREHENSIVE METABOLIC PANEL - Abnormal; Notable for the following:    Potassium 3.2 (*)    Albumin 3.4 (*)    All other components within normal limits  URINALYSIS, MICROSCOPIC ONLY  LIPASE, BLOOD   Dg Abd Acute W/chest  12/06/2012  *RADIOLOGY REPORT*  Clinical Data: Epigastric pain.  Nausea vomiting and diarrhea.  ACUTE ABDOMEN SERIES (ABDOMEN 2 VIEW & CHEST 1 VIEW)  Comparison: None.  Findings: The lungs are clear without infiltrates or heart failure. Minimal left effusion.  No mass lesion.  Normal bowel gas pattern.  Negative for bowel obstruction. Negative for free air.  Surgical clips in the gallbladder fossa. No acute bony abnormality.  IMPRESSION: Small left pleural effusion.  Lungs are otherwise clear.  Normal bowel gas pattern.   Original Report Authenticated By: Janeece Riggers, M.D.      1. Migraine   2.  Abdominal  pain, other specified site   3. Hypokalemia       MDM  9:28 PM 47 y.o. female w hx of migraines and mult abd surgeries in the past pw HA and abd pain that began today. Pt notes HA gradual in onset and cw previous migraines, no new features. She took prednisone, fiorcet, imitrex, benadryl at home today w/out relief. She notes abd pain mild and epig/LUQ, cw pain she has had mult times in the past. Pt AFVSS here, neurologically intact on exam. Will tx migraine w/ IVF bolus, reglan, benadryl, morphine for abd pain. Will get screening abd plain film and  labs.   HA persists, will give Stadol, as pt states this has helped her in the past.   9:28 PM: Pt has mild hypokalemia, supplemented w/ po here. Otherwise labs non-contrib, pt feeling mildly better. Will rec pt f/u w/ her neurologist for migraines. I have discussed the diagnosis/risks/treatment options with the patient and family and believe the pt to be eligible for discharge home to follow-up with her neurologist. We also discussed returning to the ED immediately if new or worsening sx occur. We discussed the sx which are most concerning (e.g., worsening abd pain, fever, worsening HA) that necessitate immediate return. Any new prescriptions provided to the patient are listed below.  Discharge Medication List as of 12/06/2012  8:55 PM      Clinical Impression 1. Migraine   2. Abdominal  pain, other specified site   3. Hypokalemia         Purvis Sheffield, MD 12/06/12 2128

## 2013-03-01 ENCOUNTER — Emergency Department (HOSPITAL_COMMUNITY): Payer: Self-pay

## 2013-03-01 ENCOUNTER — Emergency Department (HOSPITAL_COMMUNITY)
Admission: EM | Admit: 2013-03-01 | Discharge: 2013-03-01 | Disposition: A | Discharge status: Home / Self Care | Payer: Self-pay | Attending: Emergency Medicine | Admitting: Emergency Medicine

## 2013-03-01 ENCOUNTER — Encounter (HOSPITAL_COMMUNITY): Payer: Self-pay

## 2013-03-01 DIAGNOSIS — F329 Major depressive disorder, single episode, unspecified: Secondary | ICD-10-CM | POA: Insufficient documentation

## 2013-03-01 DIAGNOSIS — Z9089 Acquired absence of other organs: Secondary | ICD-10-CM | POA: Insufficient documentation

## 2013-03-01 DIAGNOSIS — F3289 Other specified depressive episodes: Secondary | ICD-10-CM | POA: Insufficient documentation

## 2013-03-01 DIAGNOSIS — R5383 Other fatigue: Secondary | ICD-10-CM | POA: Insufficient documentation

## 2013-03-01 DIAGNOSIS — G43909 Migraine, unspecified, not intractable, without status migrainosus: Secondary | ICD-10-CM | POA: Insufficient documentation

## 2013-03-01 DIAGNOSIS — Z9889 Other specified postprocedural states: Secondary | ICD-10-CM | POA: Insufficient documentation

## 2013-03-01 DIAGNOSIS — Z90710 Acquired absence of both cervix and uterus: Secondary | ICD-10-CM | POA: Insufficient documentation

## 2013-03-01 DIAGNOSIS — R109 Unspecified abdominal pain: Secondary | ICD-10-CM | POA: Insufficient documentation

## 2013-03-01 DIAGNOSIS — R5381 Other malaise: Secondary | ICD-10-CM | POA: Insufficient documentation

## 2013-03-01 DIAGNOSIS — R112 Nausea with vomiting, unspecified: Secondary | ICD-10-CM | POA: Insufficient documentation

## 2013-03-01 DIAGNOSIS — Z87448 Personal history of other diseases of urinary system: Secondary | ICD-10-CM | POA: Insufficient documentation

## 2013-03-01 LAB — CBC WITH DIFFERENTIAL/PLATELET
HCT: 31.7 % — ABNORMAL LOW (ref 36.0–46.0)
Hemoglobin: 10.9 g/dL — ABNORMAL LOW (ref 12.0–15.0)
Lymphocytes Relative: 37 % (ref 12–46)
Lymphs Abs: 2.5 10*3/uL (ref 0.7–4.0)
Monocytes Absolute: 0.6 10*3/uL (ref 0.1–1.0)
Monocytes Relative: 9 % (ref 3–12)
Neutro Abs: 3.4 10*3/uL (ref 1.7–7.7)
RBC: 3.6 MIL/uL — ABNORMAL LOW (ref 3.87–5.11)
WBC: 6.7 10*3/uL (ref 4.0–10.5)

## 2013-03-01 LAB — LIPASE, BLOOD: Lipase: 24 U/L (ref 11–59)

## 2013-03-01 LAB — COMPREHENSIVE METABOLIC PANEL
Alkaline Phosphatase: 96 U/L (ref 39–117)
BUN: 10 mg/dL (ref 6–23)
CO2: 26 mEq/L (ref 19–32)
Chloride: 103 mEq/L (ref 96–112)
Creatinine, Ser: 0.58 mg/dL (ref 0.50–1.10)
GFR calc non Af Amer: >90 mL/min (ref >90)
Potassium: 3.5 mEq/L (ref 3.5–5.1)
Total Bilirubin: 0.4 mg/dL (ref 0.3–1.2)

## 2013-03-01 LAB — LACTIC ACID, PLASMA: Lactic Acid, Venous: 1.3 mmol/L (ref 0.5–2.2)

## 2013-03-01 MED ORDER — HYDROMORPHONE HCL PF 1 MG/ML IJ SOLN
1.0000 mg | Freq: Once | INTRAMUSCULAR | Status: AC
  Administered 2013-03-01: 1 mg via INTRAVENOUS
  Filled 2013-03-01: qty 1

## 2013-03-01 MED ORDER — MORPHINE SULFATE 4 MG/ML IJ SOLN
4.0000 mg | Freq: Once | INTRAMUSCULAR | Status: AC
  Administered 2013-03-01: 4 mg via INTRAVENOUS
  Filled 2013-03-01: qty 1

## 2013-03-01 MED ORDER — IOHEXOL 300 MG/ML  SOLN: 100.0000 mL | Freq: Once | INTRAMUSCULAR | Status: DC | PRN

## 2013-03-01 MED ORDER — ONDANSETRON HCL 4 MG/2ML IJ SOLN
4.0000 mg | Freq: Once | INTRAMUSCULAR | Status: AC
  Administered 2013-03-01: 4 mg via INTRAVENOUS
  Filled 2013-03-01: qty 2

## 2013-03-01 MED ORDER — PROMETHAZINE HCL 25 MG PO TABS: 25.0000 mg | ORAL_TABLET | Freq: Four times a day (QID) | ORAL | Status: AC | PRN

## 2013-03-01 MED ORDER — IOHEXOL 300 MG/ML  SOLN
50.0000 mL | Freq: Once | INTRAMUSCULAR | Status: AC | PRN
  Administered 2013-03-01: 50 mL via ORAL

## 2013-03-01 MED ORDER — SODIUM CHLORIDE 0.9 % IV BOLUS (SEPSIS)
1000.0000 mL | Freq: Once | INTRAVENOUS | Status: AC
  Administered 2013-03-01: 1000 mL via INTRAVENOUS

## 2013-03-01 MED ORDER — PROMETHAZINE HCL 25 MG/ML IJ SOLN
25.0000 mg | Freq: Once | INTRAMUSCULAR | Status: AC
  Administered 2013-03-01: 25 mg via INTRAVENOUS
  Filled 2013-03-01: qty 1

## 2013-03-01 MED ORDER — BUTORPHANOL TARTRATE 1 MG/ML IJ SOLN: 1.0000 mg | Freq: Once | INTRAMUSCULAR | Status: DC

## 2013-03-01 MED ORDER — OXYCODONE-ACETAMINOPHEN 5-325 MG PO TABS
2.0000 | ORAL_TABLET | Freq: Once | ORAL | Status: AC
  Administered 2013-03-01: 2 via ORAL
  Filled 2013-03-01: qty 2

## 2013-03-01 NOTE — ED Notes (Signed)
Pt states abdominal pain still unchanged, dr rancour made aware and pt just given 1mg  dilauded. Will reassess pain in 15-30 minutes. Pt has 24g IV in right wrist, will look for another site for larger IV suitable for contrast once pt gets bolus.

## 2013-03-01 NOTE — ED Notes (Signed)
Pt c/o bilateral upper quad abd pain, cramping, n/v that started earlier in the week, pt was seen at another hospital a few days ago, had complete work up done, states that the pain has gotten worse.

## 2013-03-01 NOTE — ED Notes (Signed)
Unable to establish large bore IV x2 attempt. Dr Manus Gunning states to do no IV contrast with CT scan.

## 2013-03-01 NOTE — ED Notes (Signed)
Pt reports ab pain (bil upper quads), for 1 week, +nausea and vomiting, mild diarrhea , no fever. Pain has become severe over the last week.

## 2013-03-01 NOTE — ED Provider Notes (Signed)
History     CSN: [id number]  Arrival date & time 03/01/13  1632   First MD Initiated Contact with Patient 03/01/13 1724      Chief Complaint  Patient presents with  . Abdominal Pain    (Consider location/radiation/quality/duration/timing/severity/associated sxs/prior treatment) HPI Comments: Patient presents with upper abdominal pain with nausea and vomiting that is worse since last night. This pain has been coming on for the past week. She has had this pain in the past related to bowel obstructions. She is extensive surgical history with hernia repair surgery one half years ago for hernia repair. Denies any fevers, chills, urinary or vaginal symptoms. Is unable to keep anything down today. Denies any chest pain or shortness of breath. Had a small bowel movement last night and is still passing gas today.  The history is provided by the patient.    Past Medical History  Diagnosis Date  . Migraine   . Renal disorder   . Depression     Past Surgical History  Procedure Laterality Date  . Abdominal hysterectomy    . Cholecystectomy    . Appendectomy    . Lumbar laminectomy    . Insert intraperitoneal catheter      chemo therapy    No family history on file.  History  Substance Use Topics  . Smoking status: Never Smoker   . Smokeless tobacco: Not on file  . Alcohol Use: No    OB History   Grav Para Term Preterm Abortions TAB SAB Ect Mult Living                  Review of Systems  Constitutional: Positive for activity change and appetite change. Negative for fever.  HENT: Negative for congestion and rhinorrhea.   Respiratory: Negative for cough, chest tightness and shortness of breath.   Cardiovascular: Negative for chest pain.  Gastrointestinal: Positive for nausea, vomiting and abdominal pain. Negative for diarrhea.  Genitourinary: Negative for dysuria, hematuria, vaginal bleeding and vaginal discharge.  Musculoskeletal: Negative for back pain.  Skin: Negative  for wound.  Neurological: Positive for weakness. Negative for dizziness and headaches.  A complete 10 system review of systems was obtained and all systems are negative except as noted in the HPI and PMH.    Allergies  Bee venom; Other; Sulfa antibiotics; and Toradol  Home Medications   Current Outpatient Rx  Name  Route  Sig  Dispense  Refill  . butalbital-acetaminophen-caffeine (FIORICET, ESGIC) 50-325-40 MG per tablet   Oral   Take 2 tablets by mouth every 4 (four) hours as needed. Headaches         . diphenhydrAMINE (BENADRYL) 50 MG capsule   Oral   Take 50 mg by mouth as needed (headache).         Marland Kitchen ibuprofen (ADVIL,MOTRIN) 200 MG tablet   Oral   Take 800 mg by mouth every 6 (six) hours as needed for pain.         Marland Kitchen lansoprazole (PREVACID) 15 MG capsule   Oral   Take 30 mg by mouth daily.         Marland Kitchen LORazepam (ATIVAN) 0.5 MG tablet   Oral   Take 0.5 mg by mouth 2 (two) times daily as needed for anxiety (and/or nausea).         . predniSONE (DELTASONE) 20 MG tablet   Oral   Take 60 mg by mouth as needed (headache).          Marland Kitchen  promethazine (PHENERGAN) 25 MG tablet   Oral   Take 25 mg by mouth every 6 (six) hours as needed. nausea         . SUMAtriptan (IMITREX) 100 MG tablet   Oral   Take 100 mg by mouth every 2 (two) hours as needed. migraines         . topiramate (TOPAMAX) 100 MG tablet   Oral   Take 150 mg by mouth 2 (two) times daily.         Marland Kitchen zolpidem (AMBIEN) 10 MG tablet   Oral   Take 10 mg by mouth at bedtime as needed for sleep.           BP 117/104  Pulse 99  Temp(Src) 98.4 F (36.9 C) (Oral)  Resp 22  Ht 5\' 4"  (1.626 m)  Wt 164 lb (74.39 kg)  BMI 28.14 kg/m2  SpO2 100%  Physical Exam  Constitutional: She is oriented to person, place, and time. She appears well-developed and well-nourished. No distress.  HENT:  Head: Normocephalic and atraumatic.  Mouth/Throat: Oropharynx is clear and moist. No oropharyngeal exudate.   Eyes: Conjunctivae and EOM are normal. Pupils are equal, round, and reactive to light.  Neck: Normal range of motion.  Cardiovascular: Normal rate, regular rhythm and normal heart sounds.   No murmur heard. Pulmonary/Chest: Effort normal. No respiratory distress.  Abdominal: Soft. There is tenderness. There is no rebound and no guarding.  Well-healed surgical scars. Abdomen is soft but diffusely tender, no guarding or rebound.  Musculoskeletal: Normal range of motion. She exhibits no edema and no tenderness.  Neurological: She is alert and oriented to person, place, and time. No cranial nerve deficit. Coordination normal.  Skin: Skin is warm.    ED Course  Procedures (including critical care time)  Labs Reviewed  CBC WITH DIFFERENTIAL - Abnormal; Notable for the following:    RBC 3.60 (*)    Hemoglobin 10.9 (*)    HCT 31.7 (*)    All other components within normal limits  COMPREHENSIVE METABOLIC PANEL - Abnormal; Notable for the following:    ALT 38 (*)    All other components within normal limits  LIPASE, BLOOD  LACTIC ACID, PLASMA  TROPONIN I  URINALYSIS, ROUTINE W REFLEX MICROSCOPIC   Ct Abdomen Pelvis Wo Contrast  03/01/2013  *RADIOLOGY REPORT*  Clinical Data: Abdominal pain.  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  CT ABDOMEN AND PELVIS WITHOUT CONTRAST  Technique:  Multidetector CT imaging of the abdomen and pelvis was performed following the standard protocol without intravenous contrast.  Comparison: Plain films 12/06/2012.  Findings: Linear scarring noted in the left base.  Lung bases otherwise clear.  Heart is normal size.  No effusions.  Scattered calcifications within the liver and spleen compatible with old granulomatous disease.  Prior cholecystectomy.  Pancreas, adrenals have an unremarkable unenhanced appearance.  Right nephrolithiasis with the upper pole stone measuring 5 mm.  No hydronephrosis or ureteral stones.  Urinary bladder is unremarkable.   Prior hysterectomy.  No adnexal masses.  Large and small bowel grossly unremarkable.  No free fluid, free air or adenopathy.  IMPRESSION: Right nephrolithiasis.  No acute findings in the abdomen or pelvis.   Original Report Authenticated By: Charlett Nose, M.D.      No diagnosis found.    MDM  Abdominal pain, nausea vomiting, extensive surgical history. Vital stable, no distress, abdomen soft and nonsurgical.  Labs unremarkable. Hemoglobin stable. CT scan obtained given patient's  extensive surgical history.  No obstruction or other pathology noted on CT. Patient continues to have pain and nausea. She is given additional doses of medications.  On record review patient has visits to [facility] in April 27 and May 1 with similar abdominal pain. CT scan April 27 unchanged from today's. She did not tell me about this recent CT.  Her pain, nausea and vomiting have been controlled in the ED. This appears to be chronic and recurrent problem for this patient. There is no indication for surgical intervention or acute change tonight. She is stable for discharge.   Date: 03/01/2013  Rate: 89  Rhythm: sinus arrhythmia  QRS Axis: normal  Intervals: normal  ST/T Wave abnormalities: normal  Conduction Disutrbances:none  Narrative Interpretation:   Old EKG Reviewed: none available          Glynn Octave, MD 03/01/13 2321

## 2013-09-02 IMAGING — CR DG ABDOMEN ACUTE W/ 1V CHEST
3 series · 3 of 3 positions shown · non-contrast
Comparison: None.

CLINICAL DATA: Epigastric pain.  Nausea vomiting and diarrhea.

ACUTE ABDOMEN SERIES (ABDOMEN 2 VIEW & CHEST 1 VIEW)

[w chest pa]
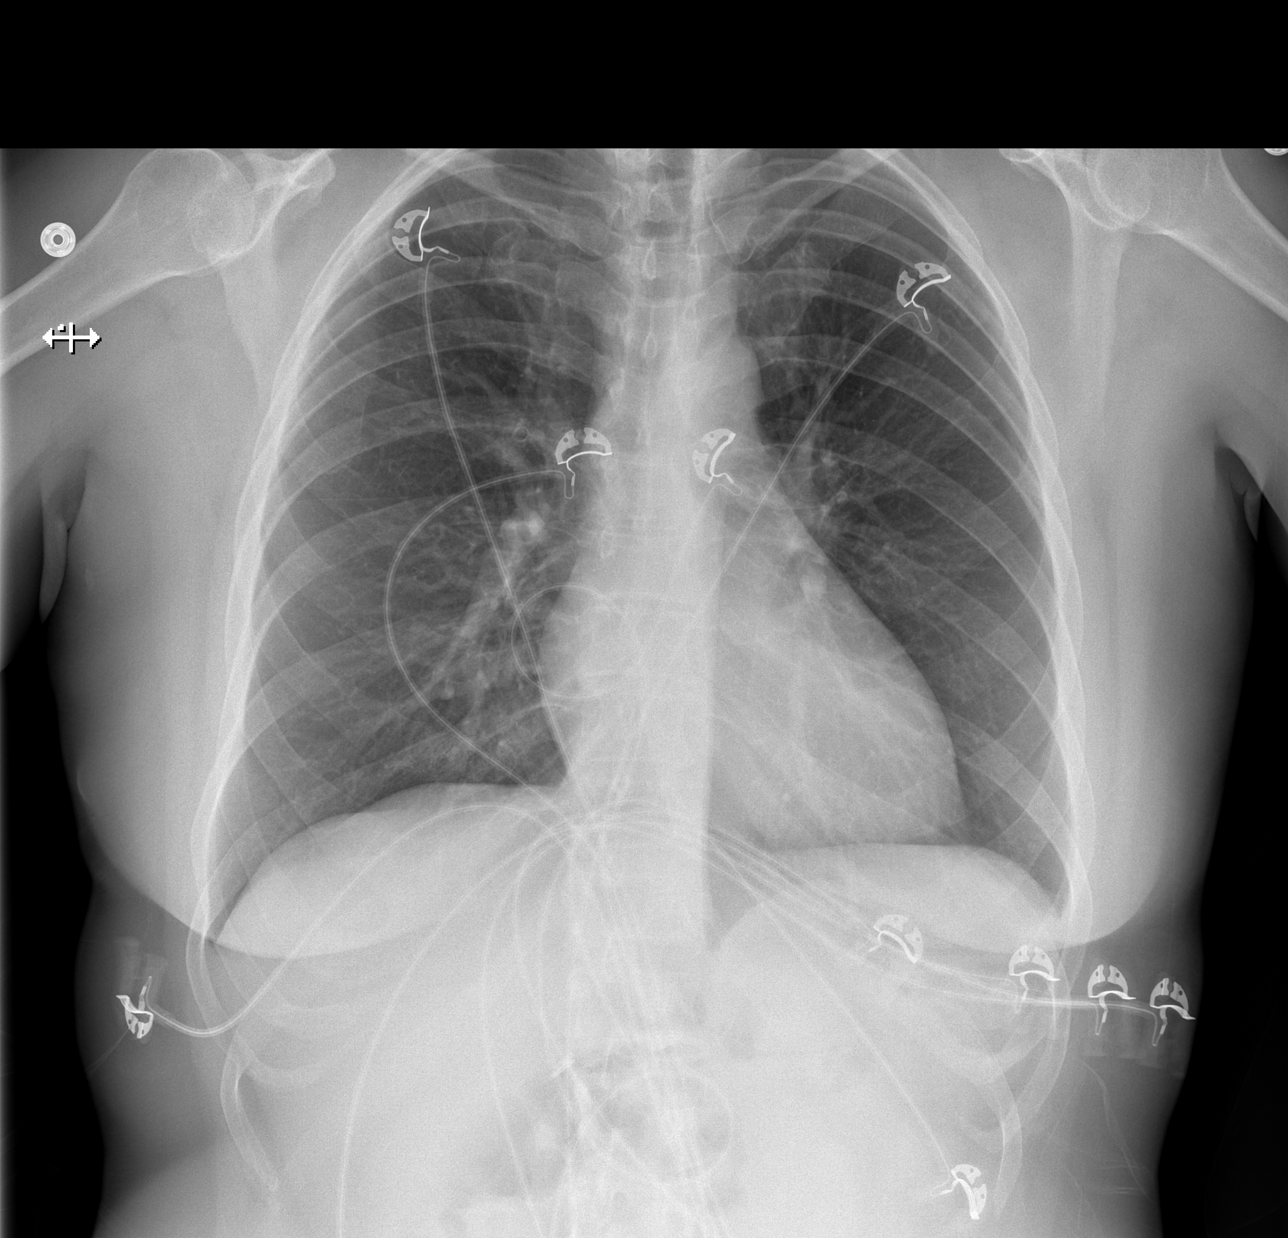

[w abdomen upright]
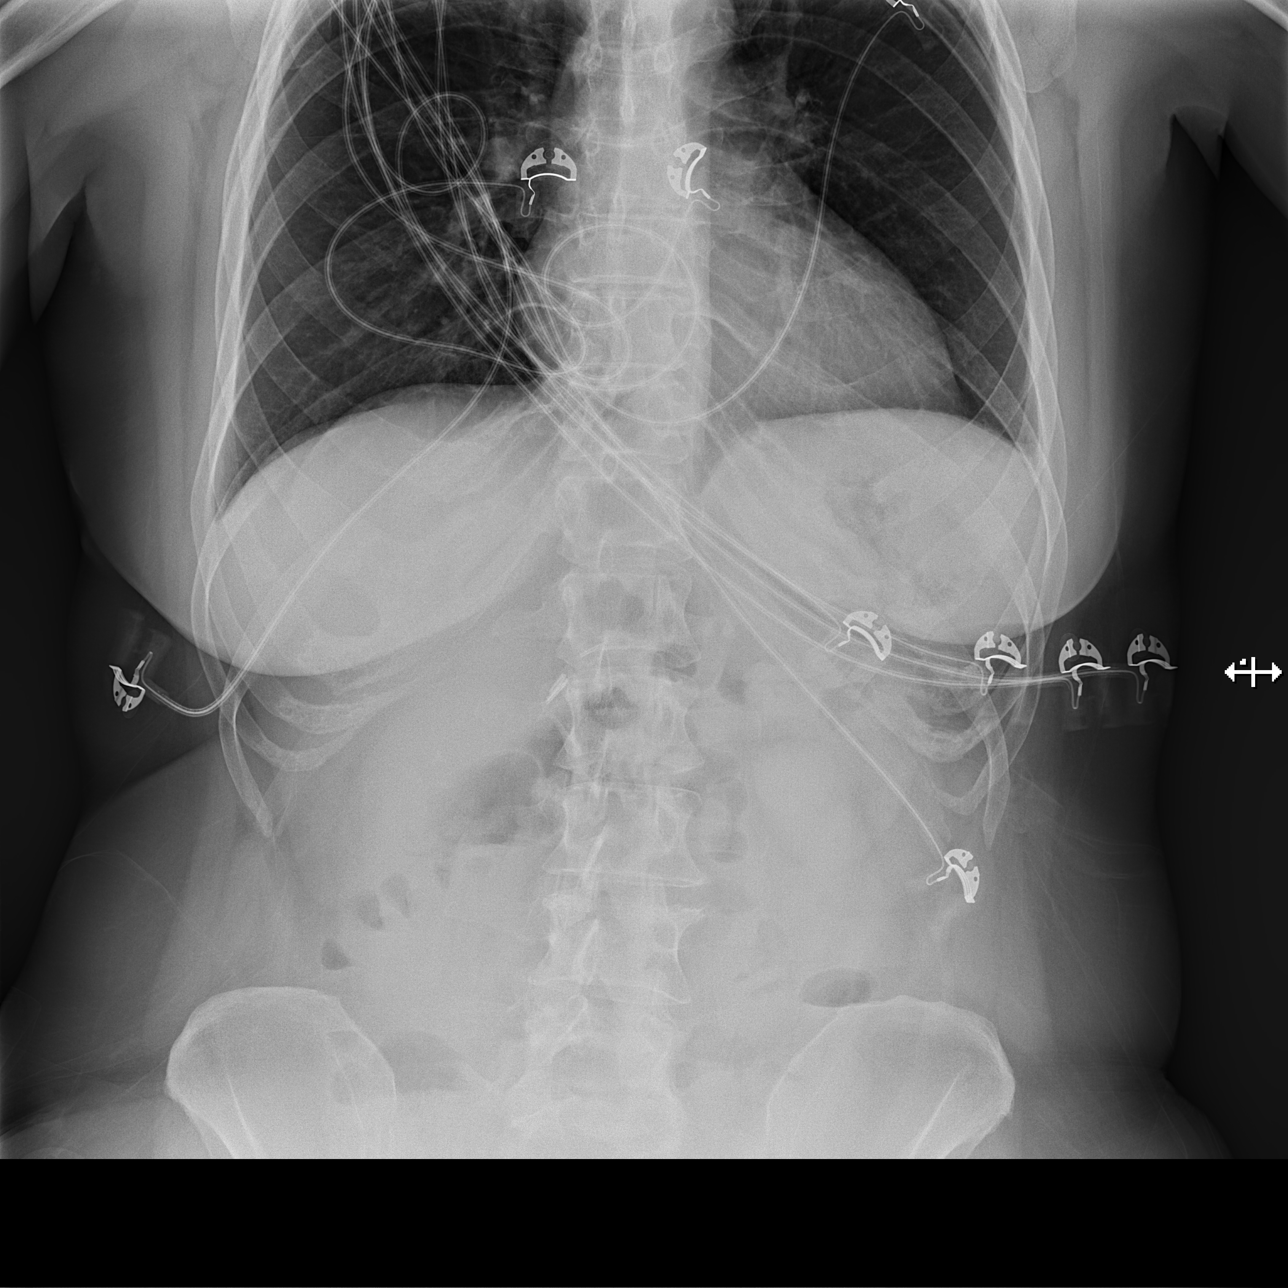

[t abdomen supine]
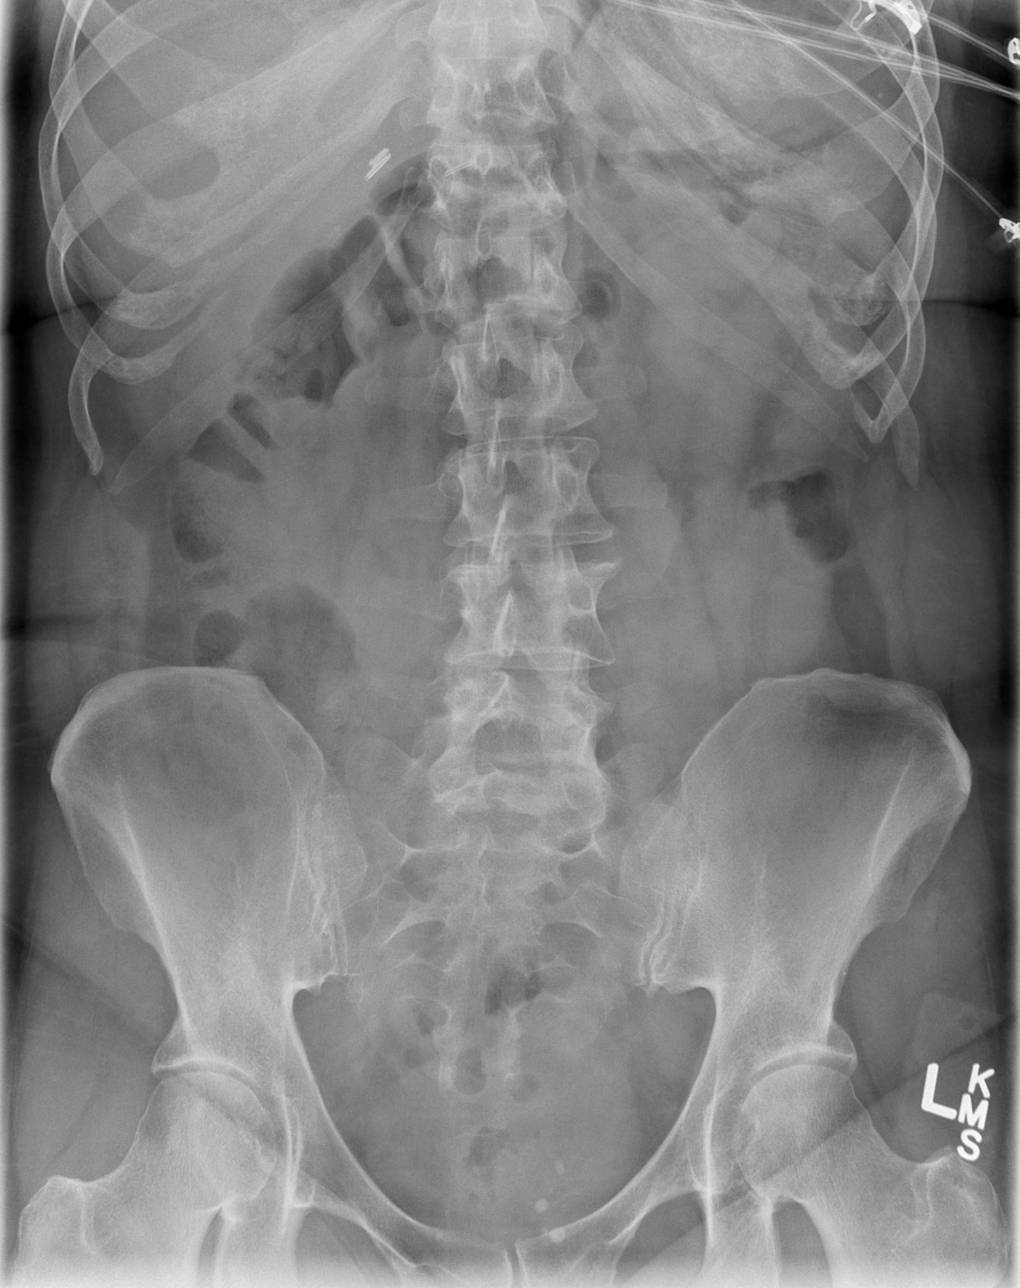

[3 of 3 positions shown; findings below may reference images not displayed]

FINDINGS: The lungs are clear without infiltrates or heart failure.
Minimal left effusion.  No mass lesion.

Normal bowel gas pattern.  Negative for bowel obstruction.
Negative for free air.  Surgical clips in the gallbladder fossa.
No acute bony abnormality.
IMPRESSION: Small left pleural effusion.  Lungs are otherwise clear.

Normal bowel gas pattern.

## 2021-10-27 ENCOUNTER — Emergency Department (HOSPITAL_COMMUNITY)
Admission: EM | Admit: 2021-10-27 | Discharge: 2021-10-28 | Disposition: A | Discharge status: Home / Self Care | Payer: BLUE CROSS/BLUE SHIELD | Attending: Emergency Medicine | Admitting: Emergency Medicine

## 2021-10-27 ENCOUNTER — Other Ambulatory Visit: Payer: Self-pay

## 2021-10-27 DIAGNOSIS — R112 Nausea with vomiting, unspecified: Secondary | ICD-10-CM | POA: Diagnosis present

## 2021-10-27 DIAGNOSIS — R1084 Generalized abdominal pain: Secondary | ICD-10-CM | POA: Diagnosis not present

## 2021-10-27 LAB — URINALYSIS, ROUTINE W REFLEX MICROSCOPIC
Bacteria, UA: NONE SEEN
Bilirubin Urine: NEGATIVE
Glucose, UA: NEGATIVE mg/dL
Hgb urine dipstick: NEGATIVE
Ketones, ur: NEGATIVE mg/dL
Nitrite: NEGATIVE
Protein, ur: NEGATIVE mg/dL
Specific Gravity, Urine: 1.027 (ref 1.005–1.030)
pH: 6 (ref 5.0–8.0)

## 2021-10-27 MED ORDER — SODIUM CHLORIDE 0.9 % IV SOLN
12.5000 mg | Freq: Four times a day (QID) | INTRAVENOUS | Status: DC | PRN
  Administered 2021-10-27: 12.5 mg via INTRAVENOUS
  Filled 2021-10-27: qty 12.5

## 2021-10-27 MED ORDER — SODIUM CHLORIDE 0.9 % IV BOLUS
1000.0000 mL | Freq: Once | INTRAVENOUS | Status: AC
  Administered 2021-10-27: 1000 mL via INTRAVENOUS

## 2021-10-27 MED ORDER — FENTANYL CITRATE PF 50 MCG/ML IJ SOSY
100.0000 ug | PREFILLED_SYRINGE | INTRAMUSCULAR | Status: DC | PRN
  Administered 2021-10-27 – 2021-10-28 (×2): 100 ug via INTRAVENOUS
  Filled 2021-10-27 (×2): qty 2

## 2021-10-27 MED ORDER — ONDANSETRON 4 MG PO TBDP
4.0000 mg | ORAL_TABLET | Freq: Once | ORAL | Status: AC
  Administered 2021-10-27: 22:00:00 4 mg via ORAL
  Filled 2021-10-27: qty 1

## 2021-10-27 NOTE — ED Triage Notes (Signed)
Patient reports abd pain rated 9/10, says she was evaluated last night and given phenergan, tried to follow up with pcp but unable to get in contact with pcp says triage nurse at pcp directed patient to ED. History of abd cancer.

## 2021-10-27 NOTE — ED Notes (Signed)
IV team consult place due to pt. Being a difficult stick and stating that she had to have a PICC line last night (10/26/2021)

## 2021-10-27 NOTE — ED Provider Notes (Signed)
[facility]-EMERGENCY DEPT Provider Note   CSN: [id number] Arrival date & time: 10/27/21  1942     History Chief Complaint  Patient presents with   Vomiting   Abdominal Pain    Jill Simmons is a 55 y.o. female.  HPI She is here for evaluation of persistent abdominal pain described as cramping, and vomiting, unable to keep medicines down.  She was evaluated and treated for the same problem yesterday and different hospital ED.  She has had ongoing symptoms like this for many years, recurrent bouts started yesterday evening.  He denies blood in emesis, fever, chills, cough, shortness of breath, weakness or dizziness.  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    Past Medical History:  Diagnosis Date   Depression    Migraine    Renal disorder     Patient Active Problem List   Diagnosis Date Noted   Abdominal pain, other specified site 12/06/2012   Migraine 12/06/2012   Hypokalemia 12/06/2012    Past Surgical History:  Procedure Laterality Date   ABDOMINAL HYSTERECTOMY     APPENDECTOMY     CHOLECYSTECTOMY     INSERT INTRAPERITONEAL CATHETER     chemo therapy   LUMBAR LAMINECTOMY       OB History   No obstetric history on file.     No family history on file.  Social History   Tobacco Use   Smoking status: Never  Substance Use Topics   Alcohol use: No   Drug use: No    Home Medications Prior to Admission medications   Medication Sig Start Date End Date Taking? Authorizing Provider  butalbital-acetaminophen-caffeine (FIORICET, ESGIC) 50-325-40 MG per tablet Take 2 tablets by mouth every 4 (four) hours as needed. Headaches    [provider]  diphenhydrAMINE (BENADRYL) 50 MG capsule Take 50 mg by mouth as needed (headache).    [provider]  ibuprofen (ADVIL,MOTRIN) 200 MG tablet Take 800 mg by mouth every 6  (six) hours as needed for pain.    [provider]  lansoprazole (PREVACID) 15 MG capsule Take 30 mg by mouth daily.    [provider]  LORazepam (ATIVAN) 0.5 MG tablet Take 0.5 mg by mouth 2 (two) times daily as needed for anxiety (and/or nausea).    [provider]  predniSONE (DELTASONE) 20 MG tablet Take 60 mg by mouth as needed (headache).     [provider]  promethazine (PHENERGAN) 25 MG tablet Take 25 mg by mouth every 6 (six) hours as needed. nausea    [provider]  promethazine (PHENERGAN) 25 MG tablet Take 1 tablet (25 mg total) by mouth every 6 (six) hours as needed for nausea. 03/01/13   Rancour, Jeannett Senior, MD  SUMAtriptan (IMITREX) 100 MG tablet Take 100 mg by mouth every 2 (two) hours as needed. migraines    [provider]  topiramate (TOPAMAX) 100 MG tablet Take 150 mg by mouth 2 (two) times daily.    [provider]  zolpidem (AMBIEN) 10 MG tablet Take 10 mg by mouth at bedtime as needed for sleep.    [provider]    Allergies    Bee venom, Other, Sulfa antibiotics, and Toradol [ketorolac tromethamine]  Review of Systems   Review of Systems  All other systems reviewed and are negative.  Physical Exam Updated Vital Signs BP 139/82 (  BP Location: Left Arm)    Pulse 90    Temp 97.8 F (36.6 C) (Oral)    Resp 18    Ht 5\' 4"  (1.626 m)    SpO2 99%    BMI 28.15 kg/m   Physical Exam Vitals and nursing note reviewed.  Constitutional:      Appearance: She is well-developed. She is not ill-appearing.  HENT:     Head: Normocephalic and atraumatic.     Right Ear: External ear normal.     Left Ear: External ear normal.     Nose: No congestion.  Eyes:     Conjunctiva/sclera: Conjunctivae normal.     Pupils: Pupils are equal, round, and reactive to light.  Neck:     Trachea: Phonation normal.  Cardiovascular:     Rate and Rhythm: Normal rate and regular rhythm.     Heart sounds: Normal heart sounds.   Pulmonary:     Effort: Pulmonary effort is normal.     Breath sounds: Normal breath sounds.  Abdominal:     General: There is no distension.     Palpations: Abdomen is soft. There is no mass.     Tenderness: There is abdominal tenderness.     Hernia: No hernia is present.  Musculoskeletal:        General: Normal range of motion.     Cervical back: Normal range of motion and neck supple.  Skin:    General: Skin is warm and dry.  Neurological:     Mental Status: She is alert and oriented to person, place, and time.     Cranial Nerves: No cranial nerve deficit.     Sensory: No sensory deficit.     Motor: No abnormal muscle tone.     Coordination: Coordination normal.  Psychiatric:        Mood and Affect: Mood normal.        Behavior: Behavior normal.        Thought Content: Thought content normal.        Judgment: Judgment normal.    ED Results / Procedures / Treatments   Labs (all labs ordered are listed, but only abnormal results are displayed) Labs Reviewed  COMPREHENSIVE METABOLIC PANEL  LIPASE, BLOOD  CBC WITH DIFFERENTIAL/PLATELET  URINALYSIS, ROUTINE W REFLEX MICROSCOPIC    EKG None  Radiology No results found.  Procedures Procedures   Medications Ordered in ED Medications  ondansetron (ZOFRAN-ODT) disintegrating tablet 4 mg (4 mg Oral Given 10/27/21 2147)    ED Course  I have reviewed the triage vital signs and the nursing notes.  Pertinent labs & imaging results that were available during my care of the patient were reviewed by me and considered in my medical decision making (see chart for details).  Clinical Course as of 10/28/21 0007  Sat Oct 28, 2021  0002 Plan is to f/u on labs and she can f/u as outpatient [DW]    Clinical Course User Index [DW] Oct 30, 2021, MD   MDM Rules/Calculators/A&P                          Patient Vitals for the past 24 hrs:  BP Temp Temp src Pulse Resp SpO2 Height  10/27/21 2200 (!) 152/109 -- -- 85 18 98 %  --  10/27/21 2015 -- -- -- -- -- -- 5\' 4"  (1.626 m)  10/27/21 2014 139/82 97.8 F (36.6 C) Oral 90 18 99 % --  11:57 PM Reevaluation with update and discussion with patient. After initial assessment and treatment, an updated evaluation reveals she is feeling better after the first dose of fentanyl and Phenergan. Illness risk, persistent pain, discussed. Mancel Bale    Medical Decision Making: Summary: She is here for evaluation of a recurrent problem characterized by abdominal pain and vomiting.  Ongoing symptoms for several years since treated for peritoneal cancer.  Critical Interventions-laboratory tests, IV fluids, parenteral narcotic and antiemetic medications; to evaluate, treat for Chief Complaint  Patient presents with   Vomiting   Abdominal Pain    and assess for illness characterized as Acute and Uncertain Prognosis   After These Interventions, the Patient was reevaluated and was found to be marginally improved after first treatment.  This patient is presenting for evaluation of recurrent abdominal pain with vomiting, which does require a range of treatment options, and is a complaint that involves a moderate risk of morbidity and mortality. The differential diagnoses include complications from adhesions, nonspecific abdominal pain, complications from vomiting. I decided to review external data, and in summary middle-aged female presenting with recurrent symptoms will be managed for this similar pain by her surgical oncologist..  I did not require additional historical information from anyone, as the patient is lucid.  Clinical Laboratory Tests Ordered, included CBC, Metabolic panel, and Urinalysis.  I did not require any additional medicine Test.  Pharmaceutical Risk Management moderate-Parenteral Treatment  Review of prior records in the EMR-she has previously had 7 CT scans of the abdomen pelvis, since September 2022, done at outside hospitals.  She was last seen  yesterday, at [facility].  At that time she had normal CT scan except for nonspecific nodules, which can be followed up by her surgical oncologist, normal labs and was discharged after treatment.     CRITICAL CARE-no Performed by: Mancel Bale  Nursing Notes Reviewed/ Care Coordinated Applicable Imaging Reviewed Interpretation of Laboratory Data incorporated into ED treatment  Transition of care to Dr. Bebe Shaggy to evaluate after treatment if needed prior to discharge.  I suspect that she can be discharged       Final Clinical Impression(s) / ED Diagnoses Final diagnoses:  Nausea and vomiting, unspecified vomiting type  Generalized abdominal pain    Rx / DC Orders ED Discharge Orders     None        Mancel Bale, MD 10/28/21 0008

## 2021-10-27 NOTE — ED Provider Notes (Signed)
Emergency Medicine Provider Triage Evaluation Note  Jill Simmons , a 55 y.o. female  was evaluated in triage.  Pt complains of abdominal pain.  Patient states that she was evaluated last night at [facility] for same complaint.  She states that she is having severe mid to left-sided abdominal pain resulting in nausea with one episode of vomiting.  She states that last night they did a CT scan which showed "polyps."  She was discharged with Norco, Phenergan.  She states today she has not been able to tolerate p.o. for nausea.  She states that the Phenergan and Norco are not working.  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  She states that the chemotherapy resulted in multiple adhesions and she is meeting with her oncologist in the near to discuss possible operation to break up adhesions.  She states she has not made a decision on this.  Review of Systems  Positive: See above Negative:   Physical Exam  BP 139/82 (BP Location: Left Arm)    Pulse 90    Temp 97.8 F (36.6 C) (Oral)    Resp 18    Ht 5\' 4"  (1.626 m)    SpO2 99%    BMI 28.15 kg/m  Gen:   Awake, no distress   Resp:  Normal effort  MSK:   Moves extremities without difficulty  Other:  Patient guarding abdomen.  Abdomen is soft.  Tender to palpation of the mid and left side.  Decreased bowel sounds.  No distention of the abdomen.  Medical Decision Making  Medically screening exam initiated at 8:27 PM.  Appropriate orders placed.  Dimmer was informed that the remainder of the evaluation will be completed by another provider, this initial triage assessment does not replace that evaluation, and the importance of remaining in the ED until their evaluation is complete.     Apolonio Schneiders, PA-C 10/27/21 2029    2030, MD 11/08/21 2005

## 2021-10-27 NOTE — ED Notes (Signed)
IV team called to notify that it would be about 40 mins before they could see the patient.

## 2021-10-28 LAB — COMPREHENSIVE METABOLIC PANEL
ALT: 13 U/L (ref 0–44)
AST: 17 U/L (ref 15–41)
Albumin: 3.5 g/dL (ref 3.5–5.0)
Alkaline Phosphatase: 73 U/L (ref 38–126)
Anion gap: 5 (ref 5–15)
BUN: 20 mg/dL (ref 6–20)
CO2: 27 mmol/L (ref 22–32)
Calcium: 8.6 mg/dL — ABNORMAL LOW (ref 8.9–10.3)
Chloride: 108 mmol/L (ref 98–111)
Creatinine, Ser: 0.66 mg/dL (ref 0.44–1.00)
GFR, Estimated: >60 mL/min (ref >60)
Glucose, Bld: 90 mg/dL (ref 70–99)
Potassium: 4 mmol/L (ref 3.5–5.1)
Sodium: 140 mmol/L (ref 135–145)
Total Bilirubin: 0.4 mg/dL (ref 0.3–1.2)
Total Protein: 6.9 g/dL (ref 6.5–8.1)

## 2021-10-28 LAB — CBC WITH DIFFERENTIAL/PLATELET
Abs Immature Granulocytes: 0 10*3/uL (ref 0.00–0.07)
Basophils Absolute: 0 10*3/uL (ref 0.0–0.1)
Basophils Relative: 0 %
Eosinophils Absolute: 0.2 10*3/uL (ref 0.0–0.5)
Eosinophils Relative: 3 %
HCT: 34.2 % — ABNORMAL LOW (ref 36.0–46.0)
Hemoglobin: 11 g/dL — ABNORMAL LOW (ref 12.0–15.0)
Immature Granulocytes: 0 %
Lymphocytes Relative: 40 %
Lymphs Abs: 2 10*3/uL (ref 0.7–4.0)
MCH: 30.9 pg (ref 26.0–34.0)
MCHC: 32.2 g/dL (ref 30.0–36.0)
MCV: 96.1 fL (ref 80.0–100.0)
Monocytes Absolute: 0.5 10*3/uL (ref 0.1–1.0)
Monocytes Relative: 10 %
Neutro Abs: 2.4 10*3/uL (ref 1.7–7.7)
Neutrophils Relative %: 47 %
Platelets: 220 10*3/uL (ref 150–400)
RBC: 3.56 MIL/uL — ABNORMAL LOW (ref 3.87–5.11)
RDW: 13.3 % (ref 11.5–15.5)
WBC: 5.1 10*3/uL (ref 4.0–10.5)
nRBC: 0 % (ref 0.0–0.2)

## 2021-10-28 LAB — LIPASE, BLOOD: Lipase: 24 U/L (ref 11–51)

## 2021-10-28 NOTE — ED Provider Notes (Signed)
I assumed care in signout to follow-up on labs.  Labs are overall unremarkable.  Patient reports she is feeling improved.  She plans for follow-up as an outpatient   Zadie Rhine, MD 10/28/21 0111

## 2021-10-28 NOTE — Discharge Instructions (Signed)
Evaluation today does not show any serious problems.  Continue using your current treatments at home, and follow-up with your surgical oncologist for further care and treatment.
